# Patient Record
Sex: Female | Born: 1990 | Race: White | Hispanic: No | Marital: Single | Smoking: Former smoker
Health system: Southern US, Community
[De-identification: ages and names within clinical notes are randomized; demographics above are authoritative.]

## PROBLEM LIST (undated history)

## (undated) DIAGNOSIS — F32A Depression, unspecified: Secondary | ICD-10-CM

## (undated) DIAGNOSIS — M76822 Posterior tibial tendinitis, left leg: Secondary | ICD-10-CM

## (undated) DIAGNOSIS — N809 Endometriosis, unspecified: Secondary | ICD-10-CM

## (undated) DIAGNOSIS — F419 Anxiety disorder, unspecified: Secondary | ICD-10-CM

## (undated) DIAGNOSIS — F909 Attention-deficit hyperactivity disorder, unspecified type: Secondary | ICD-10-CM

## (undated) DIAGNOSIS — F329 Major depressive disorder, single episode, unspecified: Secondary | ICD-10-CM

---
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
## 2012-01-13 ENCOUNTER — Encounter (HOSPITAL_COMMUNITY): Payer: Self-pay | Admitting: Emergency Medicine

## 2012-01-13 ENCOUNTER — Emergency Department (HOSPITAL_COMMUNITY)
Admission: EM | Admit: 2012-01-13 | Discharge: 2012-01-13 | Disposition: A | Discharge status: Home / Self Care | Payer: Self-pay | Attending: Emergency Medicine | Admitting: Emergency Medicine

## 2012-01-13 ENCOUNTER — Emergency Department (HOSPITAL_COMMUNITY)
Admission: EM | Admit: 2012-01-13 | Discharge: 2012-01-14 | Disposition: A | Discharge status: Home / Self Care | Payer: Self-pay | Attending: Emergency Medicine | Admitting: Emergency Medicine

## 2012-01-13 ENCOUNTER — Encounter (HOSPITAL_COMMUNITY): Payer: Self-pay | Admitting: *Deleted

## 2012-01-13 DIAGNOSIS — R109 Unspecified abdominal pain: Secondary | ICD-10-CM | POA: Insufficient documentation

## 2012-01-13 DIAGNOSIS — Z0389 Encounter for observation for other suspected diseases and conditions ruled out: Secondary | ICD-10-CM | POA: Insufficient documentation

## 2012-01-13 LAB — CBC WITH DIFFERENTIAL/PLATELET
Lymphocytes Relative: 21 % (ref 12–46)
Lymphs Abs: 2.6 10*3/uL (ref 0.7–4.0)
Neutro Abs: 8.3 10*3/uL — ABNORMAL HIGH (ref 1.7–7.7)
Neutrophils Relative %: 67 % (ref 43–77)
Platelets: 262 10*3/uL (ref 150–400)
RBC: 4.56 MIL/uL (ref 3.87–5.11)
WBC: 12.3 10*3/uL — ABNORMAL HIGH (ref 4.0–10.5)

## 2012-01-13 LAB — BASIC METABOLIC PANEL
CO2: 27 mEq/L (ref 19–32)
Glucose, Bld: 87 mg/dL (ref 70–99)
Potassium: 4 mEq/L (ref 3.5–5.1)
Sodium: 137 mEq/L (ref 135–145)

## 2012-01-13 LAB — URINALYSIS, ROUTINE W REFLEX MICROSCOPIC
Bilirubin Urine: NEGATIVE
Leukocytes, UA: NEGATIVE
Nitrite: NEGATIVE
Specific Gravity, Urine: 1.02 (ref 1.005–1.030)
pH: 8 (ref 5.0–8.0)

## 2012-01-13 LAB — PREGNANCY, URINE: Preg Test, Ur: NEGATIVE

## 2012-01-13 NOTE — ED Notes (Signed)
Pt is here with mid abdominal pain and states that this happens 1-2 times per month.  Pt has history of ovarian cyst

## 2012-01-13 NOTE — ED Notes (Signed)
Pt staes was here and went out to lay down  In her car and could not be found now she is back had labs and ua done

## 2012-01-13 NOTE — ED Notes (Signed)
Pt called to take back to room x1. No answer.

## 2012-01-13 NOTE — ED Notes (Signed)
Pt updated on wait time.  

## 2013-09-23 ENCOUNTER — Emergency Department (HOSPITAL_COMMUNITY)
Admission: EM | Admit: 2013-09-23 | Discharge: 2013-09-23 | Disposition: A | Discharge status: Home / Self Care | Payer: Self-pay | Attending: Emergency Medicine | Admitting: Emergency Medicine

## 2013-09-23 ENCOUNTER — Emergency Department (HOSPITAL_COMMUNITY): Payer: Self-pay

## 2013-09-23 ENCOUNTER — Encounter (HOSPITAL_COMMUNITY): Payer: Self-pay | Admitting: Emergency Medicine

## 2013-09-23 ENCOUNTER — Emergency Department (HOSPITAL_COMMUNITY): Payer: Medicaid Other

## 2013-09-23 DIAGNOSIS — R1031 Right lower quadrant pain: Secondary | ICD-10-CM | POA: Insufficient documentation

## 2013-09-23 DIAGNOSIS — F17200 Nicotine dependence, unspecified, uncomplicated: Secondary | ICD-10-CM | POA: Insufficient documentation

## 2013-09-23 DIAGNOSIS — R3919 Other difficulties with micturition: Secondary | ICD-10-CM | POA: Insufficient documentation

## 2013-09-23 DIAGNOSIS — N83201 Unspecified ovarian cyst, right side: Secondary | ICD-10-CM

## 2013-09-23 DIAGNOSIS — Z3202 Encounter for pregnancy test, result negative: Secondary | ICD-10-CM | POA: Insufficient documentation

## 2013-09-23 DIAGNOSIS — N83209 Unspecified ovarian cyst, unspecified side: Secondary | ICD-10-CM | POA: Insufficient documentation

## 2013-09-23 DIAGNOSIS — Z8659 Personal history of other mental and behavioral disorders: Secondary | ICD-10-CM | POA: Insufficient documentation

## 2013-09-23 DIAGNOSIS — R110 Nausea: Secondary | ICD-10-CM | POA: Insufficient documentation

## 2013-09-23 LAB — CBC WITH DIFFERENTIAL/PLATELET
BASOS PCT: 0 % (ref 0–1)
Basophils Absolute: 0 10*3/uL (ref 0.0–0.1)
EOS ABS: 0.2 10*3/uL (ref 0.0–0.7)
Eosinophils Relative: 2 % (ref 0–5)
HCT: 39.5 % (ref 36.0–46.0)
Hemoglobin: 13.5 g/dL (ref 12.0–15.0)
Lymphocytes Relative: 28 % (ref 12–46)
Lymphs Abs: 2.4 10*3/uL (ref 0.7–4.0)
MCH: 30.5 pg (ref 26.0–34.0)
MCHC: 34.2 g/dL (ref 30.0–36.0)
MCV: 89.4 fL (ref 78.0–100.0)
Monocytes Absolute: 0.8 10*3/uL (ref 0.1–1.0)
Monocytes Relative: 9 % (ref 3–12)
NEUTROS PCT: 61 % (ref 43–77)
Neutro Abs: 5.1 10*3/uL (ref 1.7–7.7)
Platelets: 230 10*3/uL (ref 150–400)
RBC: 4.42 MIL/uL (ref 3.87–5.11)
RDW: 13.6 % (ref 11.5–15.5)
WBC: 8.6 10*3/uL (ref 4.0–10.5)

## 2013-09-23 LAB — URINALYSIS, ROUTINE W REFLEX MICROSCOPIC
Bilirubin Urine: NEGATIVE
GLUCOSE, UA: NEGATIVE mg/dL
Hgb urine dipstick: NEGATIVE
Ketones, ur: NEGATIVE mg/dL
LEUKOCYTES UA: NEGATIVE
NITRITE: NEGATIVE
PH: 6 (ref 5.0–8.0)
Protein, ur: NEGATIVE mg/dL
SPECIFIC GRAVITY, URINE: 1.027 (ref 1.005–1.030)
Urobilinogen, UA: 0.2 mg/dL (ref 0.0–1.0)

## 2013-09-23 LAB — I-STAT CHEM 8, ED
BUN: 20 mg/dL (ref 6–23)
Calcium, Ion: 1.17 mmol/L (ref 1.12–1.23)
Chloride: 103 mEq/L (ref 96–112)
Creatinine, Ser: 0.9 mg/dL (ref 0.50–1.10)
GLUCOSE: 87 mg/dL (ref 70–99)
HCT: 43 % (ref 36.0–46.0)
HEMOGLOBIN: 14.6 g/dL (ref 12.0–15.0)
POTASSIUM: 3.7 meq/L (ref 3.7–5.3)
Sodium: 143 mEq/L (ref 137–147)
TCO2: 24 mmol/L (ref 0–100)

## 2013-09-23 LAB — WET PREP, GENITAL
Trich, Wet Prep: NONE SEEN
Yeast Wet Prep HPF POC: NONE SEEN

## 2013-09-23 LAB — POC URINE PREG, ED: Preg Test, Ur: NEGATIVE

## 2013-09-23 MED ORDER — IOHEXOL 300 MG/ML  SOLN
50.0000 mL | Freq: Once | INTRAMUSCULAR | Status: AC | PRN
  Administered 2013-09-23: 50 mL via ORAL

## 2013-09-23 MED ORDER — ONDANSETRON HCL 4 MG/2ML IJ SOLN
4.0000 mg | Freq: Once | INTRAMUSCULAR | Status: AC
  Administered 2013-09-23: 4 mg via INTRAVENOUS
  Filled 2013-09-23: qty 2

## 2013-09-23 MED ORDER — HYDROCODONE-ACETAMINOPHEN 5-325 MG PO TABS: 2.0000 | ORAL_TABLET | ORAL | Status: DC | PRN

## 2013-09-23 MED ORDER — ONDANSETRON HCL 4 MG PO TABS: 4.0000 mg | ORAL_TABLET | Freq: Four times a day (QID) | ORAL | Status: DC

## 2013-09-23 MED ORDER — MORPHINE SULFATE 4 MG/ML IJ SOLN
4.0000 mg | Freq: Once | INTRAMUSCULAR | Status: AC
  Administered 2013-09-23: 4 mg via INTRAVENOUS
  Filled 2013-09-23: qty 1

## 2013-09-23 MED ORDER — KETOROLAC TROMETHAMINE 30 MG/ML IJ SOLN
30.0000 mg | Freq: Once | INTRAMUSCULAR | Status: AC
  Administered 2013-09-23: 30 mg via INTRAVENOUS
  Filled 2013-09-23: qty 1

## 2013-09-23 MED ORDER — IOHEXOL 300 MG/ML  SOLN
100.0000 mL | Freq: Once | INTRAMUSCULAR | Status: AC | PRN
  Administered 2013-09-23: 100 mL via INTRAVENOUS

## 2013-09-23 NOTE — Progress Notes (Signed)
[facility] provided pt with a list of primary care resources, highlighting [facility] help [facility] establish primary care.

## 2013-09-23 NOTE — ED Notes (Signed)
Pt states she was diagnosed with cyst on right ovary in 8 th grade. Pt present with pain on right lower pelvic area.

## 2013-09-23 NOTE — ED Notes (Signed)
Patient to CT.

## 2013-09-23 NOTE — ED Provider Notes (Signed)
CSN: [id number]     Arrival date & time 09/23/13  4098 History   First MD Initiated Contact with Patient 09/23/13 [phone]     Chief Complaint  Patient presents with  . Cyst     (Consider location/radiation/quality/duration/timing/severity/associated sxs/prior Treatment) HPI Janet Mccoy is a 23 y.o. female who presents to ED with complaint of right lower abdominal pain. Patient states she developed pain when she woke up early this morning. Pain is worsened with movement. Pain does not radiate. He reports difficulty urinating. She reports similar pain in the past, states pain usually comes and goes, usually resolves on its own. She was seen for this multiple years ago was diagnosed with ovarian cysts. States she has not seen an OB/GYN or any other doctors for this since then. She states that when she gets the pain she usually takes Midol or Tylenol but it never helps. She did not take any medications prior to coming in today. She admits to nausea, denies vomiting. She denies any change in bowels. No vaginal bleeding or discharge. Last normal menstrual cycle was 2 weeks ago. Denies being pregnant.   Past Medical History  Diagnosis Date  . Ovarian cyst   . Headache(784.0)   . Depression    Past Surgical History  Procedure Laterality Date  . No past surgeries     No family history on file. History  Substance Use Topics  . Smoking status: Current Every Day Smoker -- 0.50 packs/day    Types: Cigarettes  . Smokeless tobacco: Not on file  . Alcohol Use: No   OB History   Grav Para Term Preterm Abortions TAB SAB Ect Mult Living   0              Review of Systems  Constitutional: Negative for fever and chills.  Respiratory: Negative for cough, chest tightness and shortness of breath.   Cardiovascular: Negative for chest pain, palpitations and leg swelling.  Gastrointestinal: Positive for nausea and abdominal pain. Negative for vomiting, diarrhea, constipation and blood in stool.   Genitourinary: Positive for difficulty urinating. Negative for dysuria, flank pain, vaginal bleeding, vaginal discharge, vaginal pain and pelvic pain.  Musculoskeletal: Negative for arthralgias, myalgias, neck pain and neck stiffness.  Skin: Negative for rash.  Neurological: Negative for dizziness, weakness and headaches.  All other systems reviewed and are negative.     Allergies  Review of patient's allergies indicates no known allergies.  Home Medications   Prior to Admission medications   Not on File   BP 121/72  Pulse 87  Temp(Src) 98.7 F (37.1 C) (Oral)  Resp 20  SpO2 100%  LMP 09/06/2013 Physical Exam  Nursing note and vitals reviewed. Constitutional: She appears well-developed and well-nourished. No distress.  HENT:  Head: Normocephalic.  Eyes: Conjunctivae are normal.  Neck: Neck supple.  Cardiovascular: Normal rate, regular rhythm and normal heart sounds.   Pulmonary/Chest: Effort normal and breath sounds normal. No respiratory distress. She has no wheezes. She has no rales.  Abdominal: Soft. Bowel sounds are normal. She exhibits no distension. There is tenderness. There is no rebound.  Right lower quadrant tenderness  Genitourinary:  Normal external genitalia. No vaginal discharge. No CMT. No left adnexal or uterine tenderness. Right adnexal tenderenss.   Musculoskeletal: She exhibits no edema.  Neurological: She is alert.  Skin: Skin is warm and dry.  Psychiatric: She has a normal mood and affect. Her behavior is normal.    ED Course  Procedures (including critical care time)  Labs Review Labs Reviewed  WET PREP, GENITAL - Abnormal; Notable for the following:    Clue Cells Wet Prep HPF POC FEW (*)    WBC, Wet Prep HPF POC FEW (*)    All other components within normal limits  URINALYSIS, ROUTINE W REFLEX MICROSCOPIC - Abnormal; Notable for the following:    APPearance CLOUDY (*)    All other components within normal limits  GC/CHLAMYDIA PROBE AMP   CBC WITH DIFFERENTIAL  I-STAT CHEM 8, ED  POC URINE PREG, ED    Imaging Review [place] Transvaginal Non-ob  09/23/2013   CLINICAL DATA:  CYST; rt ad pain , torsion; right adnexal pain. eval for torsion  EXAM: TRANSABDOMINAL ULTRASOUND OF PELVIS  DOPPLER ULTRASOUND OF OVARIES  TECHNIQUE: Transabdominal ultrasound examination of the pelvis was performed including evaluation of the uterus, ovaries, adnexal regions, and pelvic cul-de-sac.  Color and duplex Doppler ultrasound was utilized to evaluate blood flow to the ovaries.  COMPARISON:  None.  FINDINGS: Uterus  Measurements: 6.7 x 3 x 4.8 cm No fibroids or other mass visualized.  Endometrium  Thickness: 8 mm  No focal abnormality visualized.  Right ovary  Measurements: 4.3 x 3.8 x 4.7 cm Normal appearance/no adnexal mass.  Left ovary  Measurements: 3.2 x 1.6 x 3.3 cm Normal appearance/no adnexal mass.  Pulsed Doppler evaluation demonstrates normal low-resistance arterial and venous waveforms in both ovaries.  IMPRESSION: Unremarkable pelvic ultrasound. No sonographic evidence of ovarian torsion.   Electronically Signed   By: Salome HolmesHector  Cooper M.D.   On: 09/23/2013 10:05   [place] Pelvis Complete  09/23/2013   CLINICAL DATA:  CYST; rt ad pain , torsion; right adnexal pain. eval for torsion  EXAM: TRANSABDOMINAL ULTRASOUND OF PELVIS  DOPPLER ULTRASOUND OF OVARIES  TECHNIQUE: Transabdominal ultrasound examination of the pelvis was performed including evaluation of the uterus, ovaries, adnexal regions, and pelvic cul-de-sac.  Color and duplex Doppler ultrasound was utilized to evaluate blood flow to the ovaries.  COMPARISON:  None.  FINDINGS: Uterus  Measurements: 6.7 x 3 x 4.8 cm No fibroids or other mass visualized.  Endometrium  Thickness: 8 mm  No focal abnormality visualized.  Right ovary  Measurements: 4.3 x 3.8 x 4.7 cm Normal appearance/no adnexal mass.  Left ovary  Measurements: 3.2 x 1.6 x 3.3 cm Normal appearance/no adnexal mass.  Pulsed Doppler evaluation  demonstrates normal low-resistance arterial and venous waveforms in both ovaries.  IMPRESSION: Unremarkable pelvic ultrasound. No sonographic evidence of ovarian torsion.   Electronically Signed   By: Salome HolmesHector  Cooper M.D.   On: 09/23/2013 10:05   Ct Abdomen Pelvis W Contrast  09/23/2013   CLINICAL DATA:  RIGHT lower pelvic pain, history RIGHT ovarian cyst  EXAM: CT ABDOMEN AND PELVIS WITH CONTRAST  TECHNIQUE: Multidetector CT imaging of the abdomen and pelvis was performed using the standard protocol following bolus administration of intravenous contrast. Sagittal and coronal MPR images reconstructed from axial data set.  CONTRAST:  100mL OMNIPAQUE IOHEXOL 300 MG/ML SOLN IV. Dilute oral contrast.  COMPARISON:  05/26/2007 CT abdomen and pelvis ; correlation ultrasound pelvis 09/23/2013  FINDINGS: Lung bases clear.  Liver, spleen, pancreas, kidneys, and adrenal glands normal appearance.  Prominent central low attenuation of uterus may be related to phase of menses.  Question small complicated or hemorrhagic RIGHT ovarian cyst 2.5 x 1.9 cm, not definitely seen on preceding ultrasound.  Adnexae otherwise unremarkable.  Appendix not visualized.  Stomach and bowel loops grossly normal appearance.  No mass, adenopathy, free fluid,  or inflammatory process otherwise seen.  Small amount of air within vagina.  No acute osseous findings.  IMPRESSION: Questionable small hemorrhagic or complicated cyst in the RIGHT adnexa, not definitely visualized on preceding ultrasound, of doubtful significance based on small size.  Otherwise negative exam.   Electronically Signed   By: Ulyses SouthwardMark  Boles M.D.   On: 09/23/2013 11:49   [place] Art/ven Flow Abd Pelv Doppler  09/23/2013   CLINICAL DATA:  CYST; rt ad pain , torsion; right adnexal pain. eval for torsion  EXAM: TRANSABDOMINAL ULTRASOUND OF PELVIS  DOPPLER ULTRASOUND OF OVARIES  TECHNIQUE: Transabdominal ultrasound examination of the pelvis was performed including evaluation of the uterus,  ovaries, adnexal regions, and pelvic cul-de-sac.  Color and duplex Doppler ultrasound was utilized to evaluate blood flow to the ovaries.  COMPARISON:  None.  FINDINGS: Uterus  Measurements: 6.7 x 3 x 4.8 cm No fibroids or other mass visualized.  Endometrium  Thickness: 8 mm  No focal abnormality visualized.  Right ovary  Measurements: 4.3 x 3.8 x 4.7 cm Normal appearance/no adnexal mass.  Left ovary  Measurements: 3.2 x 1.6 x 3.3 cm Normal appearance/no adnexal mass.  Pulsed Doppler evaluation demonstrates normal low-resistance arterial and venous waveforms in both ovaries.  IMPRESSION: Unremarkable pelvic ultrasound. No sonographic evidence of ovarian torsion.   Electronically Signed   By: Salome HolmesHector  Cooper M.D.   On: 09/23/2013 10:05     EKG Interpretation None      MDM   Final diagnoses:  Right ovarian cyst   Pt with severe right sided adnexal pain. Will get labs, pelvic exam, US to rule out torsion.   10:20 AM US negative. Will get CT abd./pelvs. Pt continues to have pain in right lower abdomen, will evaluate appendix   12:29 PM CT negative other than possible right adnexal hemorrhagic cyst. Pt feeling much better. Home with pain medications, anti emetics, follow up with GYN   Filed Vitals:   09/23/13 0734 09/23/13 1129  BP: 121/72 119/78  Pulse: 87 60  Temp: 98.7 F (37.1 C)   TempSrc: Oral   Resp: 20 18  SpO2: 100% 100%     Tatyana A Kirichenko, PA-C 09/23/13 1525

## 2013-09-23 NOTE — Discharge Instructions (Signed)
Take ibuprofen or aleve for pain. norco for severe pain only. zofran for nausea. Follow up with your doctor or OB/GYN specialist.    Ovarian Cyst An ovarian cyst is a fluid-filled sac that forms on an ovary. The ovaries are small organs that produce eggs in women. Various types of cysts can form on the ovaries. Most are not cancerous. Many do not cause problems, and they often go away on their own. Some may cause symptoms and require treatment. Common types of ovarian cysts include:  Functional cysts--These cysts may occur every month during the menstrual cycle. This is normal. The cysts usually go away with the next menstrual cycle if the woman does not get pregnant. Usually, there are no symptoms with a functional cyst.  Endometrioma cysts--These cysts form from the tissue that lines the uterus. They are also called "chocolate cysts" because they become filled with blood that turns brown. This type of cyst can cause pain in the lower abdomen during intercourse and with your menstrual period.  Cystadenoma cysts--This type develops from the cells on the outside of the ovary. These cysts can get very big and cause lower abdomen pain and pain with intercourse. This type of cyst can twist on itself, cut off its blood supply, and cause severe pain. It can also easily rupture and cause a lot of pain.  Dermoid cysts--This type of cyst is sometimes found in both ovaries. These cysts may contain different kinds of body tissue, such as skin, teeth, hair, or cartilage. They usually do not cause symptoms unless they get very big.  Theca lutein cysts--These cysts occur when too much of a certain hormone (human chorionic gonadotropin) is produced and overstimulates the ovaries to produce an egg. This is most common after procedures used to assist with the conception of a baby (in vitro fertilization). CAUSES   Fertility drugs can cause a condition in which multiple large cysts are formed on the ovaries. This is  called ovarian hyperstimulation syndrome.  A condition called polycystic ovary syndrome can cause hormonal imbalances that can lead to nonfunctional ovarian cysts. SIGNS AND SYMPTOMS  Many ovarian cysts do not cause symptoms. If symptoms are present, they may include:  Pelvic pain or pressure.  Pain in the lower abdomen.  Pain during sexual intercourse.  Increasing girth (swelling) of the abdomen.  Abnormal menstrual periods.  Increasing pain with menstrual periods.  Stopping having menstrual periods without being pregnant. DIAGNOSIS  These cysts are commonly found during a routine or annual pelvic exam. Tests may be ordered to find out more about the cyst. These tests may include:  Ultrasound.  X-ray of the pelvis.  CT scan.  MRI.  Blood tests. TREATMENT  Many ovarian cysts go away on their own without treatment. Your health care provider may want to check your cyst regularly for 2-3 months to see if it changes. For women in menopause, it is particularly important to monitor a cyst closely because of the higher rate of ovarian cancer in menopausal women. When treatment is needed, it may include any of the following:  A procedure to drain the cyst (aspiration). This may be done using a long needle and ultrasound. It can also be done through a laparoscopic procedure. This involves using a thin, lighted tube with a tiny camera on the end (laparoscope) inserted through a small incision.  Surgery to remove the whole cyst. This may be done using laparoscopic surgery or an open surgery involving a larger incision in the lower abdomen.  Hormone treatment or birth control pills. These methods are sometimes used to help dissolve a cyst. HOME CARE INSTRUCTIONS   Only take over-the-counter or prescription medicines as directed by your health care provider.  Follow up with your health care provider as directed.  Get regular pelvic exams and Pap tests. SEEK MEDICAL CARE IF:   Your  periods are late, irregular, or painful, or they stop.  Your pelvic pain or abdominal pain does not go away.  Your abdomen becomes larger or swollen.  You have pressure on your bladder or trouble emptying your bladder completely.  You have pain during sexual intercourse.  You have feelings of fullness, pressure, or discomfort in your stomach.  You lose weight for no apparent reason.  You feel generally ill.  You become constipated.  You lose your appetite.  You develop acne.  You have an increase in body and facial hair.  You are gaining weight, without changing your exercise and eating habits.  You think you are pregnant. SEEK IMMEDIATE MEDICAL CARE IF:   You have increasing abdominal pain.  You feel sick to your stomach (nauseous), and you throw up (vomit).  You develop a fever that comes on suddenly.  You have abdominal pain during a bowel movement.  Your menstrual periods become heavier than usual. MAKE SURE YOU:  Understand these instructions.  Will watch your condition.  Will get help right away if you are not doing well or get worse. Document Released: 03/25/2005 Document Revised: 03/30/2013 Document Reviewed: 11/30/2012 [facility] Patient Information 2015 [employer] [place]. This information is not intended to replace advice given to you by your health care provider. Make sure you discuss any questions you have with your health care provider.

## 2013-09-23 NOTE — ED Notes (Signed)
Patient transported to Ultrasound 

## 2013-09-25 LAB — GC/CHLAMYDIA PROBE AMP
CT Probe RNA: NEGATIVE
GC PROBE AMP APTIMA: NEGATIVE

## 2013-09-27 NOTE — ED Provider Notes (Signed)
Medical screening examination/treatment/procedure(s) were performed by non-physician practitioner and as supervising physician I was immediately available for consultation/collaboration.   Toy BakerAnthony T Allen, MD 09/27/13 [phone]

## 2013-11-01 NOTE — H&P (Signed)
Lenise ArenaWhittney Keys  DICTATION # [id number] CSN# [id number]   Meriel PicaHOLLAND,Sarita Hakanson M, MD 11/01/2013 9:22 AM

## 2013-11-03 NOTE — H&P (Signed)
NAMLenise Arena:  Deriso, Deniqua             ACCOUNT NO.:  [id number]  MEDICAL RECORD NO.:  [id number]  LOCATION:                                 FACILITY:  PHYSICIAN:  Duke Salviaichard M. Marcelle OverlieHolland, M.D.DATE OF BIRTH:  January 26, 1991  DATE OF ADMISSION: DATE OF DISCHARGE:                             HISTORY & PHYSICAL   CHIEF COMPLAINT:  Acute and chronic pelvic pain.  HISTORY OF PRESENT ILLNESS:  A 23 year old, G0, P0, now currently sexually active presents for diagnostic laparoscopy to evaluate her acute and chronic pelvic pain.  She states she has had mid and right lower quadrant pain off and on since age 23 worse during her period.  It has not improved over time.  Recently was evaluated in the ED with pelvic ultrasound and showed no evidence of torsion.  Wet prep negative, GC and Chlamydia negative.  This pain has continued to worsen to the point that she prefers to have definitive evaluation.  We did discuss a trial of Lupron versus diagnostic laparoscopy.  She would prefer the latter.  This procedure including specific risks of bleeding, infection, other complications may require additional or open surgery reviewed with her which she understands and accepts.  PAST MEDICAL HISTORY:  Allergies none.  CURRENT MEDICATIONS:  Vicodin p.r.n.  PRIOR SURGERY:  None.  REVIEW OF SYSTEMS:  Otherwise unremarkable except for abdominal pain and dyspareunia.  PHYSICAL EXAMINATION:  VITAL SIGNS:  Temp 98.2, blood pressure 118/80. HEENT:  Unremarkable. NECK:  Supple without masses. LUNGS:  Clear. CARDIOVASCULAR:  Regular rate and rhythm without murmurs, rubs, or gallops noted. BREASTS:  Without masses. ABDOMEN:  Soft, flat, and nontender. GU:  Vulva, vagina and cervix normal.  Uterus mid position, normal size. No unusual nodularity or tenderness except on the right side, with some mild tenderness noted.  IMPRESSION:  Acute and chronic pelvic pain, rule out endometriosis or other causes for  pain.  PLAN:  Diagnostic laparoscopy.  Procedure and risks reviewed as above.     Hailie Searight M. Marcelle OverlieHolland, M.D.     [facility]/MEDQ  D:  11/01/2013  T:  11/01/2013  Job:  [id number]

## 2013-11-04 ENCOUNTER — Encounter (HOSPITAL_BASED_OUTPATIENT_CLINIC_OR_DEPARTMENT_OTHER): Payer: Self-pay | Admitting: *Deleted

## 2013-11-04 NOTE — Progress Notes (Signed)
NPO AFTER MN. ARRIVE AT 0600. NEEDS URINE PREG. AND HG. MAY TAKE ONE TYPE PAIN RX IF NEEDED AM DOS W/ SIPS OF WATER. PT ADVISED TO CUT BACK ON MARIJUANA USE.

## 2013-11-10 ENCOUNTER — Encounter (HOSPITAL_BASED_OUTPATIENT_CLINIC_OR_DEPARTMENT_OTHER): Payer: Self-pay | Admitting: Anesthesiology

## 2013-11-10 NOTE — Anesthesia Preprocedure Evaluation (Addendum)
Anesthesia Evaluation  Patient identified by MRN, date of birth, ID band Patient awake    Reviewed: Allergy & Precautions, H&P , NPO status , Patient's Chart, lab work & pertinent test results  Airway Mallampati: II TM Distance: >3 FB Neck ROM: Full    Dental no notable dental hx. (+) Teeth Intact, Dental Advisory Given   Pulmonary Current Smoker,  breath sounds clear to auscultation  Pulmonary exam normal       Cardiovascular Exercise Tolerance: Good negative cardio ROS  Rhythm:Regular Rate:Normal     Neuro/Psych  Headaches, negative psych ROS   GI/Hepatic negative GI ROS, Neg liver ROS,   Endo/Other  negative endocrine ROS  Renal/GU negative Renal ROS  negative genitourinary   Musculoskeletal negative musculoskeletal ROS (+)   Abdominal   Peds negative pediatric ROS (+)  Hematology negative hematology ROS (+)   Anesthesia Other Findings   Reproductive/Obstetrics negative OB ROS                        Anesthesia Physical Anesthesia Plan  ASA: II  Anesthesia Plan: General   Post-op Pain Management:    Induction: Intravenous  Airway Management Planned: Oral ETT  Additional Equipment:   Intra-op Plan:   Post-operative Plan: Extubation in OR  Informed Consent: I have reviewed the patients History and Physical, chart, labs and discussed the procedure including the risks, benefits and alternatives for the proposed anesthesia with the patient or authorized representative who has indicated his/her understanding and acceptance.   Dental advisory given  Plan Discussed with: CRNA and Anesthesiologist  Anesthesia Plan Comments:        Anesthesia Quick Evaluation

## 2013-11-11 ENCOUNTER — Ambulatory Visit (HOSPITAL_BASED_OUTPATIENT_CLINIC_OR_DEPARTMENT_OTHER): Payer: Medicaid Other | Admitting: Anesthesiology

## 2013-11-11 ENCOUNTER — Encounter (HOSPITAL_BASED_OUTPATIENT_CLINIC_OR_DEPARTMENT_OTHER)
Admission: RE | Disposition: A | Discharge status: Home / Self Care | Payer: Self-pay | Source: Ambulatory Visit | Attending: Obstetrics and Gynecology

## 2013-11-11 ENCOUNTER — Encounter (HOSPITAL_BASED_OUTPATIENT_CLINIC_OR_DEPARTMENT_OTHER): Payer: Self-pay | Admitting: *Deleted

## 2013-11-11 ENCOUNTER — Ambulatory Visit (HOSPITAL_BASED_OUTPATIENT_CLINIC_OR_DEPARTMENT_OTHER)
Admission: RE | Admit: 2013-11-11 | Discharge: 2013-11-11 | Disposition: A | Discharge status: Home / Self Care | Payer: Self-pay | Source: Ambulatory Visit | Attending: Obstetrics and Gynecology | Admitting: Obstetrics and Gynecology

## 2013-11-11 ENCOUNTER — Encounter (HOSPITAL_BASED_OUTPATIENT_CLINIC_OR_DEPARTMENT_OTHER): Payer: Self-pay | Admitting: Anesthesiology

## 2013-11-11 DIAGNOSIS — N803 Endometriosis of pelvic peritoneum, unspecified: Secondary | ICD-10-CM | POA: Insufficient documentation

## 2013-11-11 DIAGNOSIS — R102 Pelvic and perineal pain: Secondary | ICD-10-CM | POA: Diagnosis present

## 2013-11-11 DIAGNOSIS — G8929 Other chronic pain: Secondary | ICD-10-CM | POA: Insufficient documentation

## 2013-11-11 DIAGNOSIS — N809 Endometriosis, unspecified: Secondary | ICD-10-CM

## 2013-11-11 DIAGNOSIS — N949 Unspecified condition associated with female genital organs and menstrual cycle: Secondary | ICD-10-CM | POA: Insufficient documentation

## 2013-11-11 DIAGNOSIS — K660 Peritoneal adhesions (postprocedural) (postinfection): Secondary | ICD-10-CM | POA: Insufficient documentation

## 2013-11-11 HISTORY — PX: LAPAROSCOPY: SHX197

## 2013-11-11 LAB — POCT HEMOGLOBIN-HEMACUE: HEMOGLOBIN: 13 g/dL (ref 12.0–15.0)

## 2013-11-11 LAB — POCT PREGNANCY, URINE: Preg Test, Ur: NEGATIVE

## 2013-11-11 SURGERY — LAPAROSCOPY, DIAGNOSTIC
Anesthesia: General | Site: Abdomen

## 2013-11-11 MED ORDER — HYDROCODONE-ACETAMINOPHEN 5-325 MG PO TABS: 1.0000 | ORAL_TABLET | Freq: Four times a day (QID) | ORAL | Status: DC | PRN

## 2013-11-11 MED ORDER — ACETAMINOPHEN 10 MG/ML IV SOLN
INTRAVENOUS | Status: DC | PRN
  Administered 2013-11-11: 1000 mg via INTRAVENOUS

## 2013-11-11 MED ORDER — DEXAMETHASONE SODIUM PHOSPHATE 4 MG/ML IJ SOLN
INTRAMUSCULAR | Status: DC | PRN
  Administered 2013-11-11: 10 mg via INTRAVENOUS

## 2013-11-11 MED ORDER — HYDROCODONE-ACETAMINOPHEN 5-325 MG PO TABS
ORAL_TABLET | ORAL | Status: AC
  Filled 2013-11-11: qty 1

## 2013-11-11 MED ORDER — PROPOFOL 10 MG/ML IV BOLUS
INTRAVENOUS | Status: DC | PRN
  Administered 2013-11-11: 50 mg via INTRAVENOUS
  Administered 2013-11-11: 150 mg via INTRAVENOUS

## 2013-11-11 MED ORDER — LIDOCAINE HCL 4 % MT SOLN
OROMUCOSAL | Status: DC | PRN
  Administered 2013-11-11: 2 mL via TOPICAL

## 2013-11-11 MED ORDER — FENTANYL CITRATE 0.05 MG/ML IJ SOLN
25.0000 ug | INTRAMUSCULAR | Status: DC | PRN
  Administered 2013-11-11: 50 ug via INTRAVENOUS
  Filled 2013-11-11: qty 1

## 2013-11-11 MED ORDER — MIDAZOLAM HCL 2 MG/2ML IJ SOLN
INTRAMUSCULAR | Status: AC
  Filled 2013-11-11: qty 2

## 2013-11-11 MED ORDER — FENTANYL CITRATE 0.05 MG/ML IJ SOLN
INTRAMUSCULAR | Status: AC
  Filled 2013-11-11: qty 2

## 2013-11-11 MED ORDER — IBUPROFEN 800 MG PO TABS
800.0000 mg | ORAL_TABLET | Freq: Four times a day (QID) | ORAL | Status: DC | PRN
  Filled 2013-11-11: qty 1

## 2013-11-11 MED ORDER — FENTANYL CITRATE 0.05 MG/ML IJ SOLN
INTRAMUSCULAR | Status: DC | PRN
  Administered 2013-11-11 (×2): 50 ug via INTRAVENOUS

## 2013-11-11 MED ORDER — LACTATED RINGERS IR SOLN
Status: DC | PRN
  Administered 2013-11-11: 3000 mL

## 2013-11-11 MED ORDER — SUCCINYLCHOLINE CHLORIDE 20 MG/ML IJ SOLN
INTRAMUSCULAR | Status: DC | PRN
  Administered 2013-11-11: 100 mg via INTRAVENOUS

## 2013-11-11 MED ORDER — ONDANSETRON HCL 4 MG/2ML IJ SOLN
INTRAMUSCULAR | Status: DC | PRN
  Administered 2013-11-11: 4 mg via INTRAVENOUS

## 2013-11-11 MED ORDER — LACTATED RINGERS IV SOLN
INTRAVENOUS | Status: DC
  Administered 2013-11-11 (×2): via INTRAVENOUS
  Filled 2013-11-11: qty 1000

## 2013-11-11 MED ORDER — MIDAZOLAM HCL 5 MG/5ML IJ SOLN
INTRAMUSCULAR | Status: DC | PRN
  Administered 2013-11-11: 2 mg via INTRAVENOUS

## 2013-11-11 MED ORDER — HYDROCODONE-ACETAMINOPHEN 5-325 MG PO TABS
1.0000 | ORAL_TABLET | Freq: Four times a day (QID) | ORAL | Status: DC | PRN
  Administered 2013-11-11: 2 via ORAL
  Filled 2013-11-11: qty 2

## 2013-11-11 MED ORDER — BUPIVACAINE HCL (PF) 0.25 % IJ SOLN
INTRAMUSCULAR | Status: DC | PRN
  Administered 2013-11-11: 6 mL

## 2013-11-11 MED ORDER — LIDOCAINE HCL (CARDIAC) 20 MG/ML IV SOLN
INTRAVENOUS | Status: DC | PRN
  Administered 2013-11-11: 80 mg via INTRAVENOUS

## 2013-11-11 MED ORDER — KETOROLAC TROMETHAMINE 30 MG/ML IJ SOLN
INTRAMUSCULAR | Status: DC | PRN
  Administered 2013-11-11: 30 mg via INTRAVENOUS

## 2013-11-11 MED ORDER — FENTANYL CITRATE 0.05 MG/ML IJ SOLN
INTRAMUSCULAR | Status: AC
  Filled 2013-11-11: qty 4

## 2013-11-11 MED ORDER — STERILE WATER FOR IRRIGATION IR SOLN
Status: DC | PRN
  Administered 2013-11-11: 500 mL

## 2013-11-11 MED ORDER — PROMETHAZINE HCL 25 MG/ML IJ SOLN
6.2500 mg | INTRAMUSCULAR | Status: DC | PRN
  Filled 2013-11-11: qty 1

## 2013-11-11 SURGICAL SUPPLY — 40 items
ADH SKN CLS APL DERMABOND .7 (GAUZE/BANDAGES/DRESSINGS) ×1
BLADE SURG 11 STRL SS (BLADE) ×3 IMPLANT
CATH ROBINSON RED A/P 16FR (CATHETERS) ×3 IMPLANT
CLOSURE WOUND 1/4X4 (GAUZE/BANDAGES/DRESSINGS) ×1
COVER MAYO STAND STRL (DRAPES) ×3 IMPLANT
DERMABOND ADVANCED (GAUZE/BANDAGES/DRESSINGS) ×2
DERMABOND ADVANCED .7 DNX12 (GAUZE/BANDAGES/DRESSINGS) ×1 IMPLANT
DRAPE CAMERA CLOSED 9X96 (DRAPES) ×3 IMPLANT
DRAPE UNDERBUTTOCKS STRL (DRAPE) ×3 IMPLANT
ELECT REM PT RETURN 9FT ADLT (ELECTROSURGICAL) ×3
ELECTRODE REM PT RTRN 9FT ADLT (ELECTROSURGICAL) ×1 IMPLANT
GLOVE BIO SURGEON STRL SZ 6.5 (GLOVE) ×2 IMPLANT
GLOVE BIO SURGEON STRL SZ7 (GLOVE) ×6 IMPLANT
GLOVE BIO SURGEONS STRL SZ 6.5 (GLOVE) ×1
GLOVE INDICATOR 6.5 STRL GRN (GLOVE) ×3 IMPLANT
GOWN STRL REUS W/ TWL LRG LVL3 (GOWN DISPOSABLE) ×1 IMPLANT
GOWN STRL REUS W/TWL LRG LVL3 (GOWN DISPOSABLE) ×3
GOWN STRL REUS W/TWL XL LVL3 (GOWN DISPOSABLE) ×3 IMPLANT
LEGGING LITHOTOMY PAIR STRL (DRAPES) ×3 IMPLANT
NEEDLE HYPO 25X1 1.5 SAFETY (NEEDLE) ×3 IMPLANT
NEEDLE INSUFFLATION 14GA 120MM (NEEDLE) ×3 IMPLANT
PACK BASIN DAY SURGERY FS (CUSTOM PROCEDURE TRAY) ×3 IMPLANT
PACK LAPAROSCOPY II (CUSTOM PROCEDURE TRAY) ×3 IMPLANT
PAD OB MATERNITY 4.3X12.25 (PERSONAL CARE ITEMS) ×3 IMPLANT
PAD PREP 24X48 CUFFED NSTRL (MISCELLANEOUS) ×3 IMPLANT
SCISSORS LAP 5X35 DISP (ENDOMECHANICALS) ×3 IMPLANT
SET IRRIG TUBING LAPAROSCOPIC (IRRIGATION / IRRIGATOR) ×3 IMPLANT
SOLUTION ANTI FOG 6CC (MISCELLANEOUS) ×3 IMPLANT
STRIP CLOSURE SKIN 1/4X4 (GAUZE/BANDAGES/DRESSINGS) ×2 IMPLANT
SUT MNCRL AB 4-0 PS2 18 (SUTURE) ×3 IMPLANT
SUT VIC AB 2-0 UR6 27 (SUTURE) ×3 IMPLANT
SUT VICRYL 0 UR6 27IN ABS (SUTURE) ×3 IMPLANT
SYR CONTROL 10ML LL (SYRINGE) ×3 IMPLANT
SYRINGE 10CC LL (SYRINGE) ×3 IMPLANT
TOWEL OR 17X24 6PK STRL BLUE (TOWEL DISPOSABLE) ×6 IMPLANT
TRAY DSU PREP LF (CUSTOM PROCEDURE TRAY) ×3 IMPLANT
TROCAR OPTI TIP 5M 100M (ENDOMECHANICALS) ×3 IMPLANT
TROCAR XCEL DIL TIP R 11M (ENDOMECHANICALS) ×3 IMPLANT
TUBING INSUFFLATION 10FT LAP (TUBING) ×3 IMPLANT
WATER STERILE IRR 500ML POUR (IV SOLUTION) ×3 IMPLANT

## 2013-11-11 NOTE — Anesthesia Postprocedure Evaluation (Signed)
  Anesthesia Post-op Note  Patient: Janet ArenaWhittney Mccoy  Procedure(s) Performed: Procedure(s) (LRB): LAPAROSCOPY DIAGNOSTIC, lysis of adhesions right adenexa (N/A)  Patient Location: PACU  Anesthesia Type: General  Level of Consciousness: awake and alert   Airway and Oxygen Therapy: Patient Spontanous Breathing  Post-op Pain: mild  Post-op Assessment: Post-op Vital signs reviewed, Patient's Cardiovascular Status Stable, Respiratory Function Stable, Patent Airway and No signs of Nausea or vomiting  Last Vitals:  Filed Vitals:   11/11/13 0915  BP:   Pulse: 53  Temp:   Resp: 14    Post-op Vital Signs: stable   Complications: No apparent anesthesia complications

## 2013-11-11 NOTE — Progress Notes (Signed)
The patient was re-examined with no change in status 

## 2013-11-11 NOTE — Transfer of Care (Signed)
Immediate Anesthesia Transfer of Care Note  Patient: Janet ArenaWhittney Mccoy  Procedure(s) Performed: Procedure(s): LAPAROSCOPY DIAGNOSTIC, lysis of adhesions right adenexa (N/A)  Patient Location: PACU  Anesthesia Type:General  Level of Consciousness: awake, alert , oriented and patient cooperative  Airway & Oxygen Therapy: Patient Spontanous Breathing and Patient connected to nasal cannula oxygen  Post-op Assessment: Report given to PACU RN and Post -op Vital signs reviewed and stable  Post vital signs: Reviewed and stable  Complications: No apparent anesthesia complications

## 2013-11-11 NOTE — Discharge Instructions (Signed)
°  Post Anesthesia Home Care Instructions ° °Activity: °Get plenty of rest for the remainder of the day. A responsible adult should stay with you for 24 hours following the procedure.  °For the next 24 hours, DO NOT: °-Drive a car °-Operate machinery °-Drink alcoholic beverages °-Take any medication unless instructed by your physician °-Make any legal decisions or sign important papers. ° °Meals: °Start with liquid foods such as gelatin or soup. Progress to regular foods as tolerated. Avoid greasy, spicy, heavy foods. If nausea and/or vomiting occur, drink only clear liquids until the nausea and/or vomiting subsides. Call your physician if vomiting continues. ° °Special Instructions/Symptoms: °Your throat may feel dry or sore from the anesthesia or the breathing tube placed in your throat during surgery. If this causes discomfort, gargle with warm salt water. The discomfort should disappear within 24 hours. ° °HOME CARE INSTRUCTIONS - LAPAROSCOPY ° °Wound Care: The bandaids or dressing which are placed over the skin openings may be removed the day after surgery. The incision should be kept clean and dry. The stitches do not need to be removed. Should the incision become sore, red, and swollen after the first week, check with your doctor. ° °Personal Hygiene: Shower the day after your procedure. Always wipe from front to back after elimination.  ° °Activity: Do not drive or operate any equipment today. The effects of the anesthesia are still present and drowsiness may result. Rest today, not necessarily flat bed rest, just take it easy. You may resume your normal activity in one to three days or as instructed by your physician. ° °Sexual Activity: You resume sexual activity as indicated by your physician_________. °If your laparoscopy was for a sterilization ( tubes tied ), continue current method of birth control until after your next period or ask for specific instructions from your doctor. ° °Diet: Eat a light diet  as desired this evening. You may resume a regular diet tomorrow. ° °Return to Work: Two to three days or as indicated by your doctor. ° °Expectations °After Surgery: Your surgery will cause vaginal drainage or spotting which may continue for 2-3 days. Mild abdominal discomfort or tenderness is not unusual and some shoulder pain may also be noted which can be relieved by lying flat in pain. ° °Call Your Doctor °If these Occur:  Persistent or heavy bleeding at incision site °      Redness or swelling around incision °      Elevation of temperature greater than 100 degrees F ° °Call for follow-up appointment _____________. °

## 2013-11-11 NOTE — Op Note (Signed)
Preoperative diagnosis: Acute and chronic pelvic pain  Postoperative diagnosis: Same plus pelvic endometriosis, right adnexal adhesions  Procedure: Diagnostic laparoscopy, lysis of adhesions  Surgeon: Marcelle OverlieHolland  Anesthesia: Gen.  EBL: Less than 10 cc  Drains: In and out Foley catheter  Procedure and findings:  Patient taken the operating room after an adequate level of general anesthesia was obtained with the patient's legs in stirrups the abdomen perineum and vagina were prepped and draped in usual fashion for laparoscopy. The bladder was drained, EUA was carried out uterus was midposition, normal size, adnexa negative. Hulka tenaculum was positioned. This was done after appropriate timeout for taken.  Attention directed to the abdomen the subumbilical area was infiltrated with quarter percent Marcaine plain small incision was made in the varies needle was introduced without difficulty. Its intra-abdominal position was verified by pressure and water testing. After 2 L pneumoperitoneum syncopated, lap scopic trocar and sleeve were then introduced that difficulty. There was no evidence of any bleeding or trauma. 3 finger breaths above the symphysis in the midline a 5 mm trocar was inserted under direct visualization. The patient placed in Trendelenburg and the pelvic findings as follows  The upper abdomen was unremarkable as was the ileum ascending colon area and appendix.  The uterus itself was normal size the serosa appeared be normal the anterior peritoneum was unremarkable. The left adnexa tube and ovary appeared to be normal I have run the left pelvic sidewall there were a few peritoneal defects were some early stage endometriosis noted. I right side there were some filmy periadnexal adhesions around the right ovary which were lysed upon Nob elevation a blunt dissection. There were some early stage endometriosis noted around the peritoneum on the lateral pelvic wall where the head of the  adhesions had been stuck. Once these areas for further documented, the uterosacral ligaments were inspected and noted be unremarkable in the deep cul-de-sac. Nezhat was then used to irrigate and aspirate, operative sites were hemostatic. Its was removed, gout gas allowed to escape because closed with 4-0 Monocryl subcuticular at the umbilicus and Dermabond at the suprapubic. She tolerated this well went to recovery room in good condition.  She received Toradol and Tylenol per anesthesia  Duke Salviaichard M. Marcelle OverlieHolland M.D. and

## 2013-11-11 NOTE — Anesthesia Procedure Notes (Signed)
Procedure Name: Intubation Date/Time: 11/11/2013 7:37 AM Performed by: Wanita Chamberlain Pre-anesthesia Checklist: Patient identified, Timeout performed, Emergency Drugs available, Suction available and Patient being monitored Patient Re-evaluated:Patient Re-evaluated prior to inductionOxygen Delivery Method: Circle system utilized Preoxygenation: Pre-oxygenation with 100% oxygen Intubation Type: IV induction Ventilation: Mask ventilation without difficulty Laryngoscope Size: Mac and 3 Grade View: Grade I Tube type: Oral Tube size: 7.0 mm Number of attempts: 1 Airway Equipment and Method: Stylet,  Bite block and LTA kit utilized Placement Confirmation: ETT inserted through vocal cords under direct vision,  positive ETCO2 and breath sounds checked- equal and bilateral Secured at: 21 cm Tube secured with: Tape Dental Injury: Teeth and Oropharynx as per pre-operative assessment

## 2013-11-12 ENCOUNTER — Encounter (HOSPITAL_BASED_OUTPATIENT_CLINIC_OR_DEPARTMENT_OTHER): Payer: Self-pay | Admitting: Obstetrics and Gynecology

## 2014-03-29 ENCOUNTER — Other Ambulatory Visit (HOSPITAL_COMMUNITY): Payer: Self-pay | Admitting: Obstetrics and Gynecology

## 2014-03-29 DIAGNOSIS — N809 Endometriosis, unspecified: Secondary | ICD-10-CM

## 2014-03-29 DIAGNOSIS — R1031 Right lower quadrant pain: Secondary | ICD-10-CM

## 2014-03-29 DIAGNOSIS — R102 Pelvic and perineal pain: Secondary | ICD-10-CM

## 2014-03-30 ENCOUNTER — Encounter (INDEPENDENT_AMBULATORY_CARE_PROVIDER_SITE_OTHER): Payer: Self-pay

## 2014-03-30 ENCOUNTER — Ambulatory Visit (HOSPITAL_COMMUNITY)
Admission: RE | Admit: 2014-03-30 | Discharge: 2014-03-30 | Disposition: A | Discharge status: Home / Self Care | Payer: BC Managed Care – PPO | Source: Ambulatory Visit | Attending: Obstetrics and Gynecology | Admitting: Obstetrics and Gynecology

## 2014-03-30 ENCOUNTER — Encounter (HOSPITAL_COMMUNITY): Payer: Self-pay

## 2014-03-30 DIAGNOSIS — R102 Pelvic and perineal pain: Secondary | ICD-10-CM | POA: Diagnosis not present

## 2014-03-30 DIAGNOSIS — R1031 Right lower quadrant pain: Secondary | ICD-10-CM | POA: Diagnosis not present

## 2014-03-30 DIAGNOSIS — N809 Endometriosis, unspecified: Secondary | ICD-10-CM | POA: Diagnosis present

## 2014-03-30 MED ORDER — IOHEXOL 300 MG/ML  SOLN
100.0000 mL | Freq: Once | INTRAMUSCULAR | Status: AC | PRN
  Administered 2014-03-30: 100 mL via INTRAVENOUS

## 2015-11-02 ENCOUNTER — Emergency Department (HOSPITAL_COMMUNITY): Payer: Worker's Compensation

## 2015-11-02 ENCOUNTER — Emergency Department (HOSPITAL_COMMUNITY)
Admission: EM | Admit: 2015-11-02 | Discharge: 2015-11-02 | Disposition: A | Discharge status: Home / Self Care | Payer: Worker's Compensation | Attending: Emergency Medicine | Admitting: Emergency Medicine

## 2015-11-02 ENCOUNTER — Encounter (HOSPITAL_COMMUNITY): Payer: Self-pay | Admitting: *Deleted

## 2015-11-02 DIAGNOSIS — F17210 Nicotine dependence, cigarettes, uncomplicated: Secondary | ICD-10-CM | POA: Diagnosis not present

## 2015-11-02 DIAGNOSIS — S92422A Displaced fracture of distal phalanx of left great toe, initial encounter for closed fracture: Secondary | ICD-10-CM | POA: Diagnosis not present

## 2015-11-02 DIAGNOSIS — Y999 Unspecified external cause status: Secondary | ICD-10-CM | POA: Insufficient documentation

## 2015-11-02 DIAGNOSIS — Y929 Unspecified place or not applicable: Secondary | ICD-10-CM | POA: Diagnosis not present

## 2015-11-02 DIAGNOSIS — Y9389 Activity, other specified: Secondary | ICD-10-CM | POA: Diagnosis not present

## 2015-11-02 DIAGNOSIS — Z79899 Other long term (current) drug therapy: Secondary | ICD-10-CM | POA: Diagnosis not present

## 2015-11-02 DIAGNOSIS — W208XXA Other cause of strike by thrown, projected or falling object, initial encounter: Secondary | ICD-10-CM | POA: Insufficient documentation

## 2015-11-02 DIAGNOSIS — S92499A Other fracture of unspecified great toe, initial encounter for closed fracture: Secondary | ICD-10-CM

## 2015-11-02 DIAGNOSIS — S99922A Unspecified injury of left foot, initial encounter: Secondary | ICD-10-CM | POA: Diagnosis present

## 2015-11-02 MED ORDER — TRAMADOL HCL 50 MG PO TABS: 50.0000 mg | ORAL_TABLET | Freq: Four times a day (QID) | ORAL | 0 refills | Status: DC | PRN

## 2015-11-02 MED ORDER — NAPROXEN 500 MG PO TABS: 500.0000 mg | ORAL_TABLET | Freq: Two times a day (BID) | ORAL | 0 refills | Status: DC

## 2015-11-02 MED ORDER — HYDROCODONE-ACETAMINOPHEN 5-325 MG PO TABS
2.0000 | ORAL_TABLET | Freq: Once | ORAL | Status: AC
  Administered 2015-11-02: 2 via ORAL
  Filled 2015-11-02: qty 2

## 2015-11-02 NOTE — ED Triage Notes (Signed)
Pt reports dropping a heavy door on left big toe. Appears in severe pain at triage.

## 2015-11-02 NOTE — ED Provider Notes (Signed)
[facility]-EMERGENCY DEPT Provider Note   CSN: [id number] Arrival date & time: 11/02/15  1324  First Provider Contact:  First MD Initiated Contact with Patient 11/02/15 1411   By signing my name below, I, Janet Mccoy, attest that this documentation has been prepared under the direction and in the presence of non-physician practitioner, Santiago Glad, PA-C. Electronically Signed: Freida Mccoy, Scribe. 11/02/2015. 2:21 PM.  History   Chief Complaint Chief Complaint  Patient presents with  . Foot Pain    The history is provided by the patient. No language interpreter was used.    HPI Comments:  Janet Mccoy is a 25 y.o. female who presents to the Emergency Department complaining of 10/10, constant, left great toe pain s/p injury which occurred ~15-20 minutes PTA. Pt states she was helping move a sliding glass door when she dropped her end onto the toes. She notes mild associated numbness of the top of the right great toe. No alleviating factors noted. Pt has no other complaints or injuries at this time.    Past Medical History:  Diagnosis Date  . Headache(784.0)   . Pelvic pain in female     Patient Active Problem List   Diagnosis Date Noted  . Endometriosis 11/11/2013  . Pelvic pain in female 11/11/2013    Past Surgical History:  Procedure Laterality Date  . LAPAROSCOPY N/A 11/11/2013   Procedure: LAPAROSCOPY DIAGNOSTIC, lysis of adhesions right adenexa;  Surgeon: Meriel Pica, MD;  Location: [facility];  Service: Gynecology;  Laterality: N/A;    OB History    Gravida Para Term Preterm AB Living   0             SAB TAB Ectopic Multiple Live Births                   Home Medications    Prior to Admission medications   Medication Sig Start Date End Date Taking? Authorizing Provider  HYDROcodone-acetaminophen (NORCO) 5-325 MG per tablet Take 1-2 tablets by mouth every 6 (six) hours as needed for moderate pain. 11/11/13   Richarda Overlie, MD     Family History History reviewed. No pertinent family history.  Social History Social History  Substance Use Topics  . Smoking status: Current Every Day Smoker    Packs/day: 0.50    Years: 6.00    Types: Cigarettes  . Smokeless tobacco: Never Used  . Alcohol use Yes     Comment: RARE      Allergies   Review of patient's allergies indicates no known allergies.   Review of Systems Review of Systems  Constitutional: Negative for chills and fever.  Respiratory: Negative for shortness of breath.   Cardiovascular: Negative for chest pain.  Musculoskeletal: Positive for arthralgias and myalgias.       Right great toe     Physical Exam Updated Vital Signs BP 100/77 (BP Location: Left Arm)   Pulse 80   Temp 98.5 F (36.9 C) (Oral)   Resp 18   LMP 10/19/2015   SpO2 100%   Physical Exam  Constitutional: She is oriented to person, place, and time. She appears well-developed and well-nourished. No distress.  HENT:  Head: Normocephalic and atraumatic.  Eyes: Conjunctivae are normal.  Cardiovascular: Normal rate.   Pulses:      Dorsalis pedis pulses are 2+ on the left side.  Pulmonary/Chest: Effort normal.  Musculoskeletal:  Bruising of the left great toe and small superficial abrasion to the dorsal aspect of the  left great toe; bruising under the toenail but no subungual hematoma Distal sensation intact   Neurological: She is alert and oriented to person, place, and time.  Skin: Skin is warm and dry.  Psychiatric: She has a normal mood and affect.  Nursing note and vitals reviewed.    ED Treatments / Results  DIAGNOSTIC STUDIES:  Oxygen Saturation is 100% on RA, normal by my interpretation.    COORDINATION OF CARE:  2:18 PM Pt updated with XR results.  Discussed treatment plan with pt at bedside and pt agreed to plan.  Radiology Dg Foot Complete Left  Result Date: 11/02/2015 CLINICAL DATA:  Dropped a sliding glass door on foot at work today around 1 p.m.  Great toe pain and swelling. Initial encounter. EXAM: LEFT FOOT - COMPLETE 3+ VIEW COMPARISON:  None. FINDINGS: Comminuted fracture distal phalanx great toe without evidence for extension to the articular surface. No other fracture evident. No subluxation or dislocation. IMPRESSION: Comminuted fracture involving the distal phalanx great toe. Electronically Signed   By: Kennith Center M.D.   On: 11/02/2015 14:07   Procedures Procedures   Medications Ordered in ED Medications  HYDROcodone-acetaminophen (NORCO/VICODIN) 5-325 MG per tablet 2 tablet (2 tablets Oral Given 11/02/15 1416)     Initial Impression / Assessment and Plan / ED Course  I have reviewed the triage vital signs and the nursing notes.  Pertinent labs & imaging results that were available during my care of the patient were reviewed by me and considered in my medical decision making (see chart for details).  Clinical Course      Final Clinical Impressions(s) / ED Diagnoses   Patient X-Ray showed a comminuted fracture involving the distal phalanx great toe.  Pt advised to follow up with orthopedics. Patient given post-op shoe while in ED, conservative therapy recommended and discussed. Patient will be discharged home & is agreeable with above plan. Returns precautions discussed. Pt appears safe for discharge.  Final diagnoses:  None    New Prescriptions New Prescriptions   No medications on file    I personally performed the services described in this documentation, which was scribed in my presence. The recorded information has been reviewed and is accurate.     Santiago Glad, PA-C 11/02/15 1553    Pricilla Loveless, MD 11/03/15 [phone]

## 2015-11-03 ENCOUNTER — Encounter (HOSPITAL_BASED_OUTPATIENT_CLINIC_OR_DEPARTMENT_OTHER): Payer: Self-pay

## 2015-11-03 ENCOUNTER — Telehealth (HOSPITAL_BASED_OUTPATIENT_CLINIC_OR_DEPARTMENT_OTHER): Payer: Self-pay

## 2016-05-20 ENCOUNTER — Ambulatory Visit (HOSPITAL_COMMUNITY)
Admission: EM | Admit: 2016-05-20 | Discharge: 2016-05-20 | Disposition: A | Discharge status: Home / Self Care | Payer: BLUE CROSS/BLUE SHIELD | Attending: Family Medicine | Admitting: Family Medicine

## 2016-05-20 ENCOUNTER — Encounter (HOSPITAL_COMMUNITY): Payer: Self-pay | Admitting: Emergency Medicine

## 2016-05-20 DIAGNOSIS — B9789 Other viral agents as the cause of diseases classified elsewhere: Secondary | ICD-10-CM

## 2016-05-20 DIAGNOSIS — J069 Acute upper respiratory infection, unspecified: Secondary | ICD-10-CM | POA: Diagnosis not present

## 2016-05-20 HISTORY — DX: Attention-deficit hyperactivity disorder, unspecified type: F90.9

## 2016-05-20 MED ORDER — BENZONATATE 100 MG PO CAPS: 100.0000 mg | ORAL_CAPSULE | Freq: Three times a day (TID) | ORAL | 0 refills | Status: DC

## 2016-05-20 NOTE — ED Provider Notes (Signed)
CSN: [id number]     Arrival date & time 05/20/16  1939 History   None    Chief Complaint  Patient presents with  . Cough   (Consider location/radiation/quality/duration/timing/severity/associated sxs/prior Treatment) 26 year old female presents with fever, muscle aches, body aches, congestion, headaches, and cough since Friday, denies nausea, vomiting, diarrhea, abdominal pain, has had reduced appetite, has been drinking fluids, does not smoke, no history of asthma.   The history is provided by the patient.  Cough    Past Medical History:  Diagnosis Date  . ADHD   . Headache(784.0)   . Pelvic pain in female    Past Surgical History:  Procedure Laterality Date  . LAPAROSCOPY N/A 11/11/2013   Procedure: LAPAROSCOPY DIAGNOSTIC, lysis of adhesions right adenexa;  Surgeon: Meriel Pica, MD;  Location: [facility];  Service: Gynecology;  Laterality: N/A;   History reviewed. No pertinent family history. Social History  Substance Use Topics  . Smoking status: Current Every Day Smoker    Packs/day: 0.50    Years: 6.00    Types: Cigarettes  . Smokeless tobacco: Never Used  . Alcohol use Yes     Comment: RARE    OB History    Gravida Para Term Preterm AB Living   0             SAB TAB Ectopic Multiple Live Births                 Review of Systems  Reason unable to perform ROS: as covered in HPI.  Respiratory: Positive for cough.   All other systems reviewed and are negative.   Allergies  Patient has no known allergies.  Home Medications   Prior to Admission medications   Medication Sig Start Date End Date Taking? Authorizing Provider  amphetamine-dextroamphetamine (ADDERALL) 20 MG tablet Take 20 mg by mouth daily.   Yes Historical Provider, MD  benzonatate (TESSALON) 100 MG capsule Take 1 capsule (100 mg total) by mouth every 8 (eight) hours. 05/20/16   Dorena Bodo, NP   Meds Ordered and Administered this Visit  Medications - No data to  display  BP 116/61 (BP Location: Right Arm)   Pulse 107   Temp 98.7 F (37.1 C) (Oral)   Resp 16   SpO2 100%  No data found.   Physical Exam  Constitutional: She is oriented to person, place, and time. She appears well-developed and well-nourished. She appears ill. No distress.  HENT:  Head: Normocephalic and atraumatic.  Right Ear: Tympanic membrane and external ear normal.  Left Ear: Tympanic membrane and external ear normal.  Nose: Nose normal. Right sinus exhibits no maxillary sinus tenderness and no frontal sinus tenderness. Left sinus exhibits no maxillary sinus tenderness and no frontal sinus tenderness.  Mouth/Throat: Uvula is midline, oropharynx is clear and moist and mucous membranes are normal. No oropharyngeal exudate. Tonsils are 0 on the right. Tonsils are 0 on the left. No tonsillar exudate.  Eyes: Pupils are equal, round, and reactive to light.  Neck: Normal range of motion. Neck supple. No JVD present.  Cardiovascular: Normal rate and regular rhythm.   Pulmonary/Chest: Effort normal and breath sounds normal. No respiratory distress. She has no wheezes.  Abdominal: Soft. Bowel sounds are normal. She exhibits no distension. There is no tenderness. There is no guarding.  Lymphadenopathy:       Head (right side): No submental, no submandibular and no tonsillar adenopathy present.       Head (  left side): No submental, no submandibular and no tonsillar adenopathy present.    She has no cervical adenopathy.  Neurological: She is alert and oriented to person, place, and time.  Skin: Skin is warm and dry. Capillary refill takes less than 2 seconds. She is not diaphoretic.  Psychiatric: She has a normal mood and affect.  Nursing note and vitals reviewed.   Urgent Care Course     Procedures (including critical care time)  Labs Review Labs Reviewed - No data to display  Imaging Review No results found.   Visual Acuity Review  Right Eye Distance:   Left Eye  Distance:   Bilateral Distance:    Right Eye Near:   Left Eye Near:    Bilateral Near:         MDM   1. Viral URI with cough   You most likely have a viral URI, I advise rest, plenty of fluids and management of symptoms with over the counter medicines. For symptoms you may take Tylenol as needed every 4-6 hours for body aches or fever, not to exceed 4,000 mg a day, Take mucinex or mucinex DM ever 12 hours with a full glass of water, you may use an inhaled steroid such as Flonase, 2 sprays each nostril once a day for congestion, or an antihistamine such as Claritin or Zyrtec once a day. For cough, I have prescribed a medication called Tessalon. Take 1 tablet every 8 hours as needed for your cough. Should your symptoms worsen or fail to resolve, follow up with your primary care provider or return to clinic.       Dorena BodoLawrence Rehan Holness, NP 05/20/16 2132

## 2016-05-20 NOTE — ED Triage Notes (Signed)
[facility] a [facility] cough, headache and body aches x 4 days.

## 2016-05-20 NOTE — Discharge Instructions (Signed)
You most likely have a viral URI, I advise rest, plenty of fluids and management of symptoms with over the counter medicines. For symptoms you may take Tylenol as needed every 4-6 hours for body aches or fever, not to exceed 4,000 mg a day, Take mucinex or mucinex DM ever 12 hours with a full glass of water, you may use an inhaled steroid such as Flonase, 2 sprays each nostril once a day for congestion, or an antihistamine such as Claritin or Zyrtec once a day. For cough, I have prescribed a medication called Tessalon. Take 1 tablet every 8 hours as needed for your cough. Should your symptoms worsen or fail to resolve, follow up with your primary care provider or return to clinic.  °

## 2016-10-06 DIAGNOSIS — M76822 Posterior tibial tendinitis, left leg: Secondary | ICD-10-CM

## 2016-10-06 HISTORY — DX: Posterior tibial tendinitis, left leg: M76.822

## 2016-10-25 ENCOUNTER — Encounter (HOSPITAL_BASED_OUTPATIENT_CLINIC_OR_DEPARTMENT_OTHER): Payer: Self-pay | Admitting: *Deleted

## 2016-10-25 ENCOUNTER — Other Ambulatory Visit: Payer: Self-pay | Admitting: Orthopedic Surgery

## 2016-10-29 ENCOUNTER — Encounter (HOSPITAL_BASED_OUTPATIENT_CLINIC_OR_DEPARTMENT_OTHER): Payer: Self-pay | Admitting: *Deleted

## 2016-10-31 ENCOUNTER — Ambulatory Visit (HOSPITAL_BASED_OUTPATIENT_CLINIC_OR_DEPARTMENT_OTHER): Payer: Worker's Compensation | Admitting: Certified Registered"

## 2016-10-31 ENCOUNTER — Encounter (HOSPITAL_BASED_OUTPATIENT_CLINIC_OR_DEPARTMENT_OTHER): Payer: Self-pay | Admitting: Certified Registered"

## 2016-10-31 ENCOUNTER — Ambulatory Visit (HOSPITAL_BASED_OUTPATIENT_CLINIC_OR_DEPARTMENT_OTHER)
Admission: RE | Admit: 2016-10-31 | Discharge: 2016-10-31 | Disposition: A | Discharge status: Home / Self Care | Payer: Worker's Compensation | Source: Ambulatory Visit | Attending: Orthopedic Surgery | Admitting: Orthopedic Surgery

## 2016-10-31 ENCOUNTER — Encounter (HOSPITAL_BASED_OUTPATIENT_CLINIC_OR_DEPARTMENT_OTHER)
Admission: RE | Disposition: A | Discharge status: Home / Self Care | Payer: Self-pay | Source: Ambulatory Visit | Attending: Orthopedic Surgery

## 2016-10-31 DIAGNOSIS — X58XXXA Exposure to other specified factors, initial encounter: Secondary | ICD-10-CM | POA: Diagnosis not present

## 2016-10-31 DIAGNOSIS — Z87891 Personal history of nicotine dependence: Secondary | ICD-10-CM | POA: Diagnosis not present

## 2016-10-31 DIAGNOSIS — M65872 Other synovitis and tenosynovitis, left ankle and foot: Secondary | ICD-10-CM | POA: Insufficient documentation

## 2016-10-31 DIAGNOSIS — F329 Major depressive disorder, single episode, unspecified: Secondary | ICD-10-CM | POA: Insufficient documentation

## 2016-10-31 DIAGNOSIS — F909 Attention-deficit hyperactivity disorder, unspecified type: Secondary | ICD-10-CM | POA: Insufficient documentation

## 2016-10-31 DIAGNOSIS — F419 Anxiety disorder, unspecified: Secondary | ICD-10-CM | POA: Diagnosis not present

## 2016-10-31 DIAGNOSIS — S93422A Sprain of deltoid ligament of left ankle, initial encounter: Secondary | ICD-10-CM | POA: Insufficient documentation

## 2016-10-31 DIAGNOSIS — M76822 Posterior tibial tendinitis, left leg: Secondary | ICD-10-CM | POA: Diagnosis not present

## 2016-10-31 HISTORY — DX: Anxiety disorder, unspecified: F41.9

## 2016-10-31 HISTORY — DX: Endometriosis, unspecified: N80.9

## 2016-10-31 HISTORY — PX: TENOLYSIS: SHX396

## 2016-10-31 HISTORY — DX: Posterior tibial tendinitis, left leg: M76.822

## 2016-10-31 LAB — POCT HEMOGLOBIN-HEMACUE: HEMOGLOBIN: 12.5 g/dL (ref 12.0–15.0)

## 2016-10-31 SURGERY — INCISION, TENDON SHEATH
Anesthesia: General | Site: Ankle | Laterality: Left

## 2016-10-31 MED ORDER — CEFAZOLIN SODIUM-DEXTROSE 2-4 GM/100ML-% IV SOLN
INTRAVENOUS | Status: AC
  Filled 2016-10-31: qty 100

## 2016-10-31 MED ORDER — HYDROCODONE-ACETAMINOPHEN 5-325 MG PO TABS
1.0000 | ORAL_TABLET | Freq: Once | ORAL | Status: AC | PRN
  Administered 2016-10-31: 1 via ORAL

## 2016-10-31 MED ORDER — FENTANYL CITRATE (PF) 100 MCG/2ML IJ SOLN
INTRAMUSCULAR | Status: AC
  Filled 2016-10-31: qty 2

## 2016-10-31 MED ORDER — BUPIVACAINE-EPINEPHRINE 0.5% -1:200000 IJ SOLN
INTRAMUSCULAR | Status: DC | PRN
  Administered 2016-10-31: 13 mL

## 2016-10-31 MED ORDER — DOCUSATE SODIUM 100 MG PO CAPS: 100.0000 mg | ORAL_CAPSULE | Freq: Two times a day (BID) | ORAL | 0 refills | Status: DC

## 2016-10-31 MED ORDER — LACTATED RINGERS IV SOLN
INTRAVENOUS | Status: DC
  Administered 2016-10-31 (×2): via INTRAVENOUS

## 2016-10-31 MED ORDER — HYDROCODONE-ACETAMINOPHEN 5-325 MG PO TABS: 1.0000 | ORAL_TABLET | ORAL | 0 refills | Status: DC | PRN

## 2016-10-31 MED ORDER — MIDAZOLAM HCL 2 MG/2ML IJ SOLN
1.0000 mg | INTRAMUSCULAR | Status: DC | PRN
  Administered 2016-10-31: 2 mg via INTRAVENOUS

## 2016-10-31 MED ORDER — KETOROLAC TROMETHAMINE 10 MG PO TABS: 10.0000 mg | ORAL_TABLET | Freq: Four times a day (QID) | ORAL | 0 refills | Status: DC | PRN

## 2016-10-31 MED ORDER — SODIUM CHLORIDE 0.9 % IV SOLN: INTRAVENOUS | Status: DC

## 2016-10-31 MED ORDER — CHLORHEXIDINE GLUCONATE 4 % EX LIQD: 60.0000 mL | Freq: Once | CUTANEOUS | Status: DC

## 2016-10-31 MED ORDER — PROPOFOL 10 MG/ML IV BOLUS
INTRAVENOUS | Status: DC | PRN
  Administered 2016-10-31: 150 mg via INTRAVENOUS

## 2016-10-31 MED ORDER — SCOPOLAMINE 1 MG/3DAYS TD PT72: 1.0000 | MEDICATED_PATCH | Freq: Once | TRANSDERMAL | Status: DC | PRN

## 2016-10-31 MED ORDER — DEXAMETHASONE SODIUM PHOSPHATE 10 MG/ML IJ SOLN
INTRAMUSCULAR | Status: DC | PRN
  Administered 2016-10-31: 10 mg via INTRAVENOUS

## 2016-10-31 MED ORDER — MIDAZOLAM HCL 2 MG/2ML IJ SOLN
INTRAMUSCULAR | Status: AC
  Filled 2016-10-31: qty 2

## 2016-10-31 MED ORDER — ONDANSETRON HCL 4 MG/2ML IJ SOLN
INTRAMUSCULAR | Status: DC | PRN
  Administered 2016-10-31: 4 mg via INTRAVENOUS

## 2016-10-31 MED ORDER — FENTANYL CITRATE (PF) 100 MCG/2ML IJ SOLN
25.0000 ug | INTRAMUSCULAR | Status: DC | PRN
  Administered 2016-10-31: 50 ug via INTRAVENOUS

## 2016-10-31 MED ORDER — HYDROCODONE-ACETAMINOPHEN 5-325 MG PO TABS
ORAL_TABLET | ORAL | Status: AC
  Filled 2016-10-31: qty 1

## 2016-10-31 MED ORDER — CEFAZOLIN SODIUM-DEXTROSE 2-4 GM/100ML-% IV SOLN
2.0000 g | INTRAVENOUS | Status: AC
  Administered 2016-10-31: 2 g via INTRAVENOUS

## 2016-10-31 MED ORDER — BUPIVACAINE-EPINEPHRINE (PF) 0.5% -1:200000 IJ SOLN
INTRAMUSCULAR | Status: DC | PRN
  Administered 2016-10-31: 30 mL via PERINEURAL

## 2016-10-31 MED ORDER — PROMETHAZINE HCL 25 MG/ML IJ SOLN: 6.2500 mg | INTRAMUSCULAR | Status: DC | PRN

## 2016-10-31 MED ORDER — LIDOCAINE HCL (CARDIAC) 20 MG/ML IV SOLN
INTRAVENOUS | Status: DC | PRN
  Administered 2016-10-31: 50 mg via INTRAVENOUS
  Administered 2016-10-31: 30 mg via INTRAVENOUS

## 2016-10-31 MED ORDER — ENOXAPARIN SODIUM 40 MG/0.4ML ~~LOC~~ SOLN: 40.0000 mg | SUBCUTANEOUS | 0 refills | Status: DC

## 2016-10-31 MED ORDER — SENNA 8.6 MG PO TABS: 2.0000 | ORAL_TABLET | Freq: Two times a day (BID) | ORAL | 0 refills | Status: DC

## 2016-10-31 MED ORDER — FENTANYL CITRATE (PF) 100 MCG/2ML IJ SOLN
50.0000 ug | INTRAMUSCULAR | Status: DC | PRN
  Administered 2016-10-31 (×2): 50 ug via INTRAVENOUS

## 2016-10-31 SURGICAL SUPPLY — 60 items
BANDAGE ESMARK 6X9 LF (GAUZE/BANDAGES/DRESSINGS) ×1 IMPLANT
BLADE SURG 15 STRL LF DISP TIS (BLADE) ×2 IMPLANT
BLADE SURG 15 STRL SS (BLADE) ×6
BNDG COHESIVE 4X5 TAN STRL (GAUZE/BANDAGES/DRESSINGS) ×3 IMPLANT
BNDG COHESIVE 6X5 TAN STRL LF (GAUZE/BANDAGES/DRESSINGS) ×3 IMPLANT
BNDG ESMARK 4X9 LF (GAUZE/BANDAGES/DRESSINGS) IMPLANT
BNDG ESMARK 6X9 LF (GAUZE/BANDAGES/DRESSINGS) ×3
CHLORAPREP W/TINT 26ML (MISCELLANEOUS) ×6 IMPLANT
COVER BACK TABLE 60X90IN (DRAPES) ×3 IMPLANT
CUFF TOURNIQUET SINGLE 34IN LL (TOURNIQUET CUFF) ×3 IMPLANT
DECANTER SPIKE VIAL GLASS SM (MISCELLANEOUS) IMPLANT
DRAPE C-ARMOR (DRAPES) IMPLANT
DRAPE EXTREMITY T 121X128X90 (DRAPE) ×3 IMPLANT
DRAPE OEC MINIVIEW 54X84 (DRAPES) IMPLANT
DRAPE U-SHAPE 47X51 STRL (DRAPES) IMPLANT
DRSG MEPITEL 4X7.2 (GAUZE/BANDAGES/DRESSINGS) ×3 IMPLANT
DRSG PAD ABDOMINAL 8X10 ST (GAUZE/BANDAGES/DRESSINGS) ×6 IMPLANT
ELECT REM PT RETURN 9FT ADLT (ELECTROSURGICAL) ×3
ELECTRODE REM PT RTRN 9FT ADLT (ELECTROSURGICAL) ×1 IMPLANT
GAUZE SPONGE 4X4 12PLY STRL (GAUZE/BANDAGES/DRESSINGS) ×3 IMPLANT
GLOVE BIO SURGEON STRL SZ7 (GLOVE) ×6 IMPLANT
GLOVE BIO SURGEON STRL SZ8 (GLOVE) ×3 IMPLANT
GLOVE BIOGEL PI IND STRL 8 (GLOVE) ×2 IMPLANT
GLOVE BIOGEL PI INDICATOR 8 (GLOVE) ×4
GLOVE ECLIPSE 6.5 STRL STRAW (GLOVE) ×3 IMPLANT
GLOVE ECLIPSE 8.0 STRL XLNG CF (GLOVE) ×3 IMPLANT
GOWN STRL REUS W/ TWL LRG LVL3 (GOWN DISPOSABLE) ×1 IMPLANT
GOWN STRL REUS W/ TWL XL LVL3 (GOWN DISPOSABLE) ×2 IMPLANT
GOWN STRL REUS W/TWL LRG LVL3 (GOWN DISPOSABLE) ×2
GOWN STRL REUS W/TWL XL LVL3 (GOWN DISPOSABLE) ×6
NEEDLE HYPO 22GX1.5 SAFETY (NEEDLE) ×3 IMPLANT
NS IRRIG 1000ML POUR BTL (IV SOLUTION) ×3 IMPLANT
PACK BASIN DAY SURGERY FS (CUSTOM PROCEDURE TRAY) ×3 IMPLANT
PAD CAST 4YDX4 CTTN HI CHSV (CAST SUPPLIES) ×1 IMPLANT
PADDING CAST ABS 4INX4YD NS (CAST SUPPLIES)
PADDING CAST ABS COTTON 4X4 ST (CAST SUPPLIES) IMPLANT
PADDING CAST COTTON 4X4 STRL (CAST SUPPLIES) ×3
PADDING CAST COTTON 6X4 STRL (CAST SUPPLIES) ×3 IMPLANT
PENCIL BUTTON HOLSTER BLD 10FT (ELECTRODE) ×3 IMPLANT
SANITIZER HAND PURELL 535ML FO (MISCELLANEOUS) ×3 IMPLANT
SHEET MEDIUM DRAPE 40X70 STRL (DRAPES) ×3 IMPLANT
SLEEVE SCD COMPRESS KNEE MED (MISCELLANEOUS) ×3 IMPLANT
SPLINT FAST PLASTER 5X30 (CAST SUPPLIES) ×40
SPLINT PLASTER CAST FAST 5X30 (CAST SUPPLIES) ×20 IMPLANT
SPONGE LAP 18X18 X RAY DECT (DISPOSABLE) ×3 IMPLANT
STOCKINETTE 6  STRL (DRAPES) ×2
STOCKINETTE 6 STRL (DRAPES) ×1 IMPLANT
SUCTION FRAZIER HANDLE 10FR (MISCELLANEOUS) ×2
SUCTION TUBE FRAZIER 10FR DISP (MISCELLANEOUS) ×1 IMPLANT
SUT ETHILON 3 0 PS 1 (SUTURE) ×3 IMPLANT
SUT MNCRL AB 3-0 PS2 18 (SUTURE) ×3 IMPLANT
SUT MNCRL AB 4-0 PS2 18 (SUTURE) IMPLANT
SUT VIC AB 0 SH 27 (SUTURE) ×3 IMPLANT
SUT VIC AB 2-0 SH 27 (SUTURE) ×3
SUT VIC AB 2-0 SH 27XBRD (SUTURE) ×1 IMPLANT
SYR BULB 3OZ (MISCELLANEOUS) ×3 IMPLANT
TOWEL OR 17X24 6PK STRL BLUE (TOWEL DISPOSABLE) ×3 IMPLANT
TUBE CONNECTING 20'X1/4 (TUBING) ×1
TUBE CONNECTING 20X1/4 (TUBING) ×2 IMPLANT
UNDERPAD 30X30 (UNDERPADS AND DIAPERS) ×3 IMPLANT

## 2016-10-31 NOTE — Progress Notes (Signed)
AssistedDr. Turk with left, ultrasound guided, adductor canal block. Side rails up, monitors on throughout procedure. See vital signs in flow sheet. Tolerated Procedure well.  

## 2016-10-31 NOTE — Discharge Instructions (Addendum)
Janet ArthursJohn Hewitt, MD [facility]  Please read the following information regarding your care after surgery.  Medications  You only need a prescription for the narcotic pain medicine (ex. oxycodone, Percocet, Norco).  All of the other medicines listed below are available over the counter. X hydrocodone as prescribed for moderate to severe pain X ketorolac as prescribed, with food, as needed for pain  Narcotic pain medicine (ex. oxycodone, Percocet, Vicodin) will cause constipation.  To prevent this problem, take the following medicines while you are taking any pain medicine. X docusate sodium (Colace) 100 mg twice a day X senna (Senokot) 2 tablets twice a day  X To help prevent blood clots, inject lovenox subcutaneously once a day for two weeks after surgery.  You should also get up every hour while you are awake to move around.    Weight Bearing X Do not bear any weight on the operated leg or foot.  Cast / Splint / Dressing X Keep your splint, cast or dressing clean and dry.  Dont put anything (coat hanger, pencil, etc) down inside of it.  If it gets damp, use a hair dryer on the cool setting to dry it.  If it gets soaked, call the office to schedule an appointment for a cast change.   After your dressing, cast or splint is removed; you may shower, but do not soak or scrub the wound.  Allow the water to run over it, and then gently pat it dry.  Swelling It is normal for you to have swelling where you had surgery.  To reduce swelling and pain, keep your toes above your nose for at least 3 days after surgery.  It may be necessary to keep your foot or leg elevated for several weeks.  If it hurts, it should be elevated.  Follow Up Call my office at [phone] when you are discharged from the hospital or surgery center to schedule an appointment to be seen two weeks after surgery.  Call my office at [phone] if you develop a fever >101.5 F, nausea, vomiting, bleeding from the  surgical site or severe pain.     Post Anesthesia Home Care Instructions  Activity: Get plenty of rest for the remainder of the day. A responsible individual must stay with you for 24 hours following the procedure.  For the next 24 hours, DO NOT: -Drive a car -Advertising copywriterperate machinery -Drink alcoholic beverages -Take any medication unless instructed by your physician -Make any legal decisions or sign important papers.  Meals: Start with liquid foods such as gelatin or soup. Progress to regular foods as tolerated. Avoid greasy, spicy, heavy foods. If nausea and/or vomiting occur, drink only clear liquids until the nausea and/or vomiting subsides. Call your physician if vomiting continues.  Special Instructions/Symptoms: Your throat may feel dry or sore from the anesthesia or the breathing tube placed in your throat during surgery. If this causes discomfort, gargle with warm salt water. The discomfort should disappear within 24 hours.  If you had a scopolamine patch placed behind your ear for the management of post- operative nausea and/or vomiting:  1. The medication in the patch is effective for 72 hours, after which it should be removed.  Wrap patch in a tissue and discard in the trash. Wash hands thoroughly with soap and water. 2. You may remove the patch earlier than 72 hours if you experience unpleasant side effects which may include dry mouth, dizziness or visual disturbances. 3. Avoid touching the patch. Wash your hands  with soap and water after contact with the patch.   Regional Anesthesia Blocks  1. Numbness or the inability to move the "blocked" extremity may last from 3-48 hours after placement. The length of time depends on the medication injected and your individual response to the medication. If the numbness is not going away after 48 hours, call your surgeon.  2. The extremity that is blocked will need to be protected until the numbness is gone and the  Strength has returned.  Because you cannot feel it, you will need to take extra care to avoid injury. Because it may be weak, you may have difficulty moving it or using it. You may not know what position it is in without looking at it while the block is in effect.  3. For blocks in the legs and feet, returning to weight bearing and walking needs to be done carefully. You will need to wait until the numbness is entirely gone and the strength has returned. You should be able to move your leg and foot normally before you try and bear weight or walk. You will need someone to be with you when you first try to ensure you do not fall and possibly risk injury.  4. Bruising and tenderness at the needle site are common side effects and will resolve in a few days.  5. Persistent numbness or new problems with movement should be communicated to the surgeon or the [facility] [phone])/ [facility] [phone]).

## 2016-10-31 NOTE — H&P (Signed)
Janet Mccoy is an 26 y.o. female.   Chief Complaint:  Left ankle pain HPI:  26 y/o female with left medial ankle pain from posterior tibial tendonitis.  She has failed non op treatment including PT, bracing and NSAIDs.  She presents now for posterior tibial tenolysis.  Past Medical History:  Diagnosis Date  . ADHD   . Anxiety   . Depression   . Endometriosis   . Posterior tibial tendinitis of left lower extremity 10/2016    Past Surgical History:  Procedure Laterality Date  . LAPAROSCOPY N/A 11/11/2013   Procedure: LAPAROSCOPY DIAGNOSTIC, lysis of adhesions right adenexa;  Surgeon: Meriel Picaichard M Holland, MD;  [facility] ;  Service: Gynecology;  Laterality: N/A;    History reviewed. No pertinent family history. Social History:  reports that she quit smoking about a year ago. She smoked 0.00 packs per day for 6.00 years. She has never used smokeless tobacco. She reports that she drinks alcohol. She reports that she does not use drugs.  Allergies: No Known Allergies  Medications Prior to Admission  Medication Sig Dispense Refill  . Amphetamine-Dextroamphetamine (AMPHETAMINE SALT COMBO PO) Take 40 mg by mouth.    . citalopram (CELEXA) 10 MG tablet Take 10 mg by mouth daily.    Marland Kitchen. doxycycline (ADOXA) 100 MG tablet Take 100 mg by mouth 2 (two) times daily.      Results for orders placed or performed during the hospital encounter of 10/31/16 (from the past 48 hour(s))  Hemoglobin-hemacue, POC     Status: None   Collection Time: 10/31/16  8:02 AM  Result Value Ref Range   Hemoglobin 12.5 12.0 - 15.0 g/dL   No results found.  ROS  No recent f/c/n/v/wtloss  Blood pressure 127/68, pulse 92, temperature 98.4 F (36.9 C), temperature source Oral, resp. rate 18, height 5\' 7"  (1.702 m), weight 76.7 kg (169 lb), last menstrual period 10/14/2016, SpO2 100 %. Physical Exam  wn wd woman in nad.  A and  O x 4.  Mood and affect normal.  EOMI.  resp unlabored.  L ankle  with TTP along the post tibial tendon.  Able to do a single heel rise.  No lymphadenopthy.  5/5 stfrength at the PTT.    Assessment/Plan Stage 1 PTTD and left ankle pain - to OR for left posterior tibial tenolysis.  The risks and benefits of the alternative treatment options have been discussed in detail.  The patient wishes to proceed with surgery and specifically understands risks of bleeding, infection, nerve damage, blood clots, need for additional surgery, amputation and death.   Toni ArthursHEWITT, Marieann Zipp, MD 10/31/2016, 8:42 AM

## 2016-10-31 NOTE — Anesthesia Procedure Notes (Signed)
Procedure Name: LMA Insertion Date/Time: 10/31/2016 9:02 AM Performed by: Wahneta Derocher D Pre-anesthesia Checklist: Patient identified, Emergency Drugs available, Suction available and Patient being monitored Patient Re-evaluated:Patient Re-evaluated prior to induction Oxygen Delivery Method: Circle system utilized Preoxygenation: Pre-oxygenation with 100% oxygen Induction Type: IV induction Ventilation: Mask ventilation without difficulty LMA: LMA inserted LMA Size: 3.0 Number of attempts: 1 Airway Equipment and Method: Bite block Placement Confirmation: positive ETCO2 Tube secured with: Tape Dental Injury: Teeth and Oropharynx as per pre-operative assessment

## 2016-10-31 NOTE — Anesthesia Preprocedure Evaluation (Signed)
Anesthesia Evaluation  Patient identified by MRN, date of birth, ID band Patient awake    Reviewed: Allergy & Precautions, NPO status , Patient's Chart, lab work & pertinent test results  Airway Mallampati: II  TM Distance: >3 FB Neck ROM: Full    Dental  (+) Teeth Intact, Dental Advisory Given   Pulmonary former smoker,    Pulmonary exam normal breath sounds clear to auscultation       Cardiovascular negative cardio ROS Normal cardiovascular exam Rhythm:Regular Rate:Normal     Neuro/Psych PSYCHIATRIC DISORDERS Anxiety Depression negative neurological ROS     GI/Hepatic negative GI ROS, Neg liver ROS,   Endo/Other  negative endocrine ROS  Renal/GU negative Renal ROS     Musculoskeletal negative musculoskeletal ROS (+)   Abdominal   Peds  (+) ADHD Hematology negative hematology ROS (+)   Anesthesia Other Findings Day of surgery medications reviewed with the patient.  Reproductive/Obstetrics negative OB ROS                             Anesthesia Physical Anesthesia Plan  ASA: II  Anesthesia Plan: General   Post-op Pain Management:  Regional for Post-op pain   Induction: Intravenous  PONV Risk Score and Plan: 3 and Ondansetron, Dexamethasone and Midazolam  Airway Management Planned: LMA  Additional Equipment:   Intra-op Plan:   Post-operative Plan: Extubation in OR  Informed Consent: I have reviewed the patients History and Physical, chart, labs and discussed the procedure including the risks, benefits and alternatives for the proposed anesthesia with the patient or authorized representative who has indicated his/her understanding and acceptance.   Dental advisory given  Plan Discussed with: CRNA  Anesthesia Plan Comments: (Risks/benefits of general anesthesia discussed with patient including risk of damage to teeth, lips, gum, and tongue, nausea/vomiting, allergic  reactions to medications, and the possibility of heart attack, stroke and death.  All patient questions answered.  Patient wishes to proceed.)        Anesthesia Quick Evaluation

## 2016-10-31 NOTE — Op Note (Signed)
10/31/2016  9:47 AM  PATIENT:  Janet Mccoy  26 y.o. female  PRE-OPERATIVE DIAGNOSIS:  Left posterior tibial tendonitis   POST-OPERATIVE DIAGNOSIS: 1.  Left posterior tibial tendonitis       2.  Left deltoid ligament tear  Procedure(s): 1.  Left posterior tibial tendon tenolysis 2.  Left deltoid ligament secondary repair  SURGEON:  Toni ArthursJohn Mikolaj Woolstenhulme, MD  ASSISTANT: Alfredo MartinezJustin Ollis, PA-C  ANESTHESIA:   General, regional  EBL:  minimal   TOURNIQUET:   Total Tourniquet Time Documented: Thigh (Left) - 21 minutes Total: Thigh (Left) - 21 minutes  COMPLICATIONS:  None apparent  DISPOSITION:  Extubated, awake and stable to recovery.  PROCEDURE IN DETAIL:  After pre operative consent was obtained, and the correct operative site was identified, the patient was brought to the operating room and placed supine on the OR table.  Anesthesia was administered.  Pre-operative antibiotics were administered.  A surgical timeout was taken.  The left lower extremity was then prepped and draped in standard sterile fashion. The extremity was exsanguinated and the tourniquet was inflated to 250 mmHg. A curvilinear incision was then made over the medial ankle along the course of posterior tibial tendon.  Sharp dissection was carried down through the skin and subcutaneous tissue to the superficial fascia. Fascia was incised and posterior tibial tendon was identified.  Careful tenolysis was then performed along the course of the posterior tibial tendon proximally and distally after releasing the tendon sheath appropriately. There was moderate tenosynovitis around the tendon. Once this was sharply excised the deltoid ligament and spring ligament were carefully inspected. A small tear was identified at the distal extent of the deltoid ligament where it transitions into the spring ligament. This was debrided of fibrous tissue creating to healthy-appearing edges of ligament. 0 Vicryl sutures were used to imbricate the  edges of the ligament repairing the tear appropriately.    The wound was irrigated copiously. The tendon was reduced to its appropriate position and the tendon sheath. Tendon sheath was repaired simple sutures of 2-0 Vicryl. Subcutaneous tenderness tissues were repaired approximated with inverted simple sutures of 3-0 Monocryl skin incision was closed with running 3-0 nylon sterile dressings were applied followed by well-padded short leg splint tourniquet was released after application of the dressings at approximately 20 minutes patient was awakened from anesthesia and transported to the recovery room in stable condition.  FOLLOW UP PLAN: Nonweightbearing on the left lower extremity in a short leg cast. Lovenox for DVT prophylaxis. Follow up with me in the office in 2 weeks for suture removal and conversion to a short leg cast. She'll be nonweightbearing for 6 weeks postop due to ligament repair.    Alfredo MartinezJustin Ollis PA-C was present and scrubbed for the duration of the operative case. His assistance assistance was essential in positioning the patient, prepping and draping, gaining maintaining exposure, performing the operation, closing and dressing the wounds and applying the splint.

## 2016-10-31 NOTE — Anesthesia Postprocedure Evaluation (Signed)
Anesthesia Post Note  Patient: Janet ArenaWhittney Mccoy  Procedure(s) Performed: Procedure(s) (LRB): Left posterior tibial tendon tenolysis (Left)     Patient location during evaluation: PACU Anesthesia Type: General Level of consciousness: awake and alert Pain management: pain level controlled Vital Signs Assessment: post-procedure vital signs reviewed and stable Respiratory status: spontaneous breathing, nonlabored ventilation, respiratory function stable and patient connected to nasal cannula oxygen Cardiovascular status: blood pressure returned to baseline and stable Postop Assessment: no signs of nausea or vomiting Anesthetic complications: no    Last Vitals:  Vitals:   10/31/16 1045 10/31/16 1113  BP: 122/74 127/76  Pulse: 95 77  Resp: 17 18  Temp:  36.8 C    Last Pain:  Vitals:   10/31/16 1113  TempSrc:   PainSc: 3     LLE Motor Response: Purposeful movement (10/31/16 1113) LLE Sensation: Decreased (10/31/16 1113)          Cecile HearingStephen Edward Turk

## 2016-10-31 NOTE — Anesthesia Procedure Notes (Signed)
Anesthesia Regional Block: Adductor canal block   Pre-Anesthetic Checklist: ,, timeout performed, Correct Patient, Correct Site, Correct Laterality, Correct Procedure, Correct Position, site marked, Risks and benefits discussed,  Surgical consent,  Pre-op evaluation,  At surgeon's request and post-op pain management  Laterality: Left  Prep: chloraprep       Needles:  Injection technique: Single-shot  Needle Type: Echogenic Needle     Needle Length: 9cm  Needle Gauge: 21     Additional Needles:   Procedures: ultrasound guided,,,,,,,,  Narrative:  Start time: 10/31/2016 8:02 AM End time: 10/31/2016 8:07 AM Injection made incrementally with aspirations every 5 mL.  Performed by: Personally  Anesthesiologist: Cecile HearingURK, Alija Riano EDWARD  Additional Notes: No pain on injection. No increased resistance to injection. Injection made in 5cc increments.  Good needle visualization.  Patient tolerated procedure well.

## 2016-10-31 NOTE — Transfer of Care (Signed)
Immediate Anesthesia Transfer of Care Note  Patient: Janet Mccoy  Procedure(s) Performed: Procedure(s): Left posterior tibial tendon tenolysis (Left)  Patient Location: PACU  Anesthesia Type:GA combined with regional for post-op pain  Level of Consciousness: awake and patient cooperative  Airway & Oxygen Therapy: Patient Spontanous Breathing and Patient connected to face mask oxygen  Post-op Assessment: Report given to RN and Post -op Vital signs reviewed and stable  Post vital signs: Reviewed and stable  Last Vitals:  Vitals:   10/31/16 0815 10/31/16 0820  BP: 127/68   Pulse: (!) 104 92  Resp: 20 18  Temp:      Last Pain:  Vitals:   10/31/16 0741  TempSrc: Oral  PainSc: 3       Patients Stated Pain Goal: 1 (10/31/16 0741)  Complications: No apparent anesthesia complications

## 2016-11-01 ENCOUNTER — Encounter (HOSPITAL_BASED_OUTPATIENT_CLINIC_OR_DEPARTMENT_OTHER): Payer: Self-pay | Admitting: Orthopedic Surgery

## 2019-06-22 ENCOUNTER — Other Ambulatory Visit: Payer: Self-pay

## 2019-06-22 ENCOUNTER — Encounter (HOSPITAL_COMMUNITY): Payer: Self-pay

## 2019-06-22 ENCOUNTER — Ambulatory Visit (HOSPITAL_COMMUNITY)
Admission: EM | Admit: 2019-06-22 | Discharge: 2019-06-22 | Disposition: A | Discharge status: Home / Self Care | Payer: BLUE CROSS/BLUE SHIELD | Attending: Family Medicine | Admitting: Family Medicine

## 2019-06-22 DIAGNOSIS — R21 Rash and other nonspecific skin eruption: Secondary | ICD-10-CM

## 2019-06-22 DIAGNOSIS — L255 Unspecified contact dermatitis due to plants, except food: Secondary | ICD-10-CM

## 2019-06-22 MED ORDER — DEXAMETHASONE SODIUM PHOSPHATE 10 MG/ML IJ SOLN
INTRAMUSCULAR | Status: AC
  Filled 2019-06-22: qty 1

## 2019-06-22 MED ORDER — DEXAMETHASONE SODIUM PHOSPHATE 10 MG/ML IJ SOLN
10.0000 mg | Freq: Once | INTRAMUSCULAR | Status: AC
  Administered 2019-06-22: 12:00:00 10 mg via INTRAMUSCULAR

## 2019-06-22 MED ORDER — CLOBETASOL PROPIONATE 0.05 % EX GEL: CUTANEOUS | 0 refills | Status: DC

## 2019-06-22 MED ORDER — PREDNISONE 10 MG (21) PO TBPK: ORAL_TABLET | Freq: Every day | ORAL | 0 refills | Status: AC

## 2019-06-22 NOTE — ED Provider Notes (Signed)
[facility]    CSN: [id number] Arrival date & time: 06/22/19  1028      History   Chief Complaint Chief Complaint  Patient presents with  . Rash    HPI Janet Mccoy is a 29 y.o. female.   Patient states that she was exposed to some type of poison, ivy or oak she is not sure.  She was helping her dad in the woods 4 days ago.  She has developed a rash to forearms, that has also spread to her face, reports rash is also to left flank, left ear.  Reports the rash is very itchy.  Reports that she has used, lotion, cortisone cream, oatmeal bath with no relief.  States this is never happened to her before.  Denies headache, shortness of breath, sore throat, nausea, vomiting, diarrhea, other symptoms.  ROS per HPI  The history is provided by the patient.    Past Medical History:  Diagnosis Date  . ADHD   . Anxiety   . Depression   . Endometriosis   . Posterior tibial tendinitis of left lower extremity 10/2016    Patient Active Problem List   Diagnosis Date Noted  . Endometriosis 11/11/2013  . Pelvic pain in female 11/11/2013    Past Surgical History:  Procedure Laterality Date  . LAPAROSCOPY N/A 11/11/2013   Procedure: LAPAROSCOPY DIAGNOSTIC, lysis of adhesions right adenexa;  Surgeon: Margarette Asal, MD;  Location: [facility];  Service: Gynecology;  Laterality: N/A;  . TENOLYSIS Left 10/31/2016   Procedure: Left posterior tibial tendon tenolysis;  Surgeon: Wylene Simmer, MD;  Location: [facility];  Service: Orthopedics;  Laterality: Left;    OB History    Gravida  0   Para      Term      Preterm      AB      Living        SAB      TAB      Ectopic      Multiple      Live Births               Home Medications    Prior to Admission medications   Medication Sig Start Date End Date Taking? Authorizing Provider  Amphetamine-Dextroamphetamine (AMPHETAMINE SALT COMBO PO) Take 40 mg by mouth.    [provider]  citalopram (CELEXA) 10 MG tablet Take 10 mg by mouth daily.    [provider]  clobetasol (TEMOVATE) 0.05 % GEL Apply to affected area twice daily as needed for rash itching and burning 06/22/19   Faustino Congress, NP  docusate sodium (COLACE) 100 MG capsule Take 1 capsule (100 mg total) by mouth 2 (two) times daily. While taking narcotic pain medicine. 10/31/16   Corky Sing, PA-C  doxycycline (ADOXA) 100 MG tablet Take 100 mg by mouth 2 (two) times daily. 10/21/16   [provider]  enoxaparin (LOVENOX) 40 MG/0.4ML injection Inject 0.4 mLs (40 mg total) into the skin daily. 10/31/16   Corky Sing, PA-C  HYDROcodone-acetaminophen (NORCO/VICODIN) 5-325 MG tablet Take 1-2 tablets by mouth every 4 (four) hours as needed for moderate pain. For no more than 3 days. 10/31/16   Corky Sing, PA-C  ketorolac (TORADOL) 10 MG tablet Take 1 tablet (10 mg total) by mouth every 6 (six) hours as needed. 10/31/16   Corky Sing, PA-C  predniSONE (STERAPRED UNI-PAK 21 TAB) 10 MG (21) TBPK tablet Take  by mouth daily for 6 days. Take 6 tablets on day 1, 5 tablets on day 2, 4 tablets on day 3, 3 tablets on day 4, 2 tablets on day 5, 1 tablet on day 6 06/22/19 06/28/19  Moshe Cipro, NP  senna (SENOKOT) 8.6 MG TABS tablet Take 2 tablets (17.2 mg total) by mouth 2 (two) times daily. 10/31/16   Jacinta Shoe, PA-C    Family History No family history on file.  Social History Social History   Tobacco Use  . Smoking status: Former Smoker    Packs/day: 0.00    Years: 6.00    Pack years: 0.00    Quit date: 11/06/2015    Years since quitting: 3.6  . Smokeless tobacco: Never Used  Substance Use Topics  . Alcohol use: Yes    Comment: occasionally  . Drug use: No    Comment: AVERAGE USE 7GRAMS EVERY 2 DAYS     Allergies   Patient has no known allergies.   Review of Systems Review of Systems   Physical Exam Triage Vital Signs ED Triage  Vitals  Enc Vitals Group     BP 06/22/19 1130 123/78     Pulse Rate 06/22/19 1130 91     Resp 06/22/19 1130 18     Temp 06/22/19 1130 98.2 F (36.8 C)     Temp Source 06/22/19 1130 Oral     SpO2 06/22/19 1130 98 %     Weight 06/22/19 1131 175 lb (79.4 kg)     Height 06/22/19 1131 5\' 7"  (1.702 m)     Head Circumference --      Peak Flow --      Pain Score 06/22/19 1130 0     Pain Loc --      Pain Edu? --      Excl. in GC? --    No data found.  Updated Vital Signs BP 123/78 (BP Location: Right Arm)   Pulse 91   Temp 98.2 F (36.8 C) (Oral)   Resp 18   Ht 5\' 7"  (1.702 m)   Wt 175 lb (79.4 kg)   SpO2 98%   BMI 27.41 kg/m   Visual Acuity Right Eye Distance:   Left Eye Distance:   Bilateral Distance:    Right Eye Near:   Left Eye Near:    Bilateral Near:     Physical Exam Vitals and nursing note reviewed.  Constitutional:      General: She is not in acute distress.    Appearance: Normal appearance. She is well-developed and normal weight. She is not ill-appearing, toxic-appearing or diaphoretic.  HENT:     Head: Normocephalic and atraumatic.     Right Ear: Tympanic membrane normal.     Left Ear: Tympanic membrane normal.     Nose: Nose normal.  Eyes:     Extraocular Movements: Extraocular movements intact.     Conjunctiva/sclera: Conjunctivae normal.  Cardiovascular:     Rate and Rhythm: Normal rate and regular rhythm.     Heart sounds: Normal heart sounds. No murmur.  Pulmonary:     Effort: Pulmonary effort is normal. No respiratory distress.     Breath sounds: Normal breath sounds. No stridor. No wheezing, rhonchi or rales.  Chest:     Chest wall: No tenderness.  Abdominal:     General: Bowel sounds are normal. There is no distension.     Palpations: Abdomen is soft. There is no mass.     Tenderness: There  is no abdominal tenderness. There is no guarding or rebound.     Hernia: No hernia is present.  Musculoskeletal:        General: Normal range of  motion.     Cervical back: Normal range of motion and neck supple.  Skin:    General: Skin is warm and dry.     Capillary Refill: Capillary refill takes less than 2 seconds.     Findings: Rash present.     Comments: Vesicular itchy rash to both forearms, left cheek and around eyelid.    Neurological:     General: No focal deficit present.     Mental Status: She is alert and oriented to person, place, and time.  Psychiatric:        Mood and Affect: Mood normal.        Behavior: Behavior normal.      UC Treatments / Results  Labs (all labs ordered are listed, but only abnormal results are displayed) Labs Reviewed - No data to display  EKG   Radiology No results found.  Procedures Procedures (including critical care time)  Medications Ordered in UC Medications  dexamethasone (DECADRON) injection 10 mg (10 mg Intramuscular Given 06/22/19 1148)    Initial Impression / Assessment and Plan / UC Course  I have reviewed the triage vital signs and the nursing notes.  Pertinent labs & imaging results that were available during my care of the patient were reviewed by me and considered in my medical decision making (see chart for details).     Contact dermatitis, likely from known food plant contact.  Decadron 10 mg IM given in office today to help with itching.  Pred pack sent to patient's pharmacy, start tomorrow, instructed to take this medication as directed on package.  Instructed to follow-up if rash is not healing by the time she is finished with the Pred pack.  Also sent in clobetasol to apply to rash on body.  Instructed that clobetasol is not intended to use on her face.  Instructed to go to that ER if the rash spreads to cover her eyelids, has trouble breathing, trouble swallowing, other concerning symptoms. Final Clinical Impressions(s) / UC Diagnoses   Final diagnoses:  Rash  Contact dermatitis due to plants, except food, unspecified contact dermatitis type      Discharge Instructions     You have received a shot of steroids in our office today.  I have called in a steroid pack for you to start tomorrow.  Take as directed on the package.  I have also sent in clobetasol for you to apply topically to your rash.  Do not put this on your face.  Follow-up with primary care office as needed.  Go to the ER for trouble swallowing, trouble breathing, other concerning symptoms.    ED Prescriptions    Medication Sig Dispense Auth. Provider   predniSONE (STERAPRED UNI-PAK 21 TAB) 10 MG (21) TBPK tablet Take by mouth daily for 6 days. Take 6 tablets on day 1, 5 tablets on day 2, 4 tablets on day 3, 3 tablets on day 4, 2 tablets on day 5, 1 tablet on day 6 21 tablet Moshe Cipro, NP   clobetasol (TEMOVATE) 0.05 % GEL Apply to affected area twice daily as needed for rash itching and burning 30 g Moshe Cipro, NP     I have reviewed the PDMP during this encounter.   Moshe Cipro, NP 06/22/19 1938

## 2019-06-22 NOTE — ED Triage Notes (Signed)
Pt states she and her dad were cutting down a tree 4 days ago and she developed red raised bumps on arms bilat, left flank area and left ear. Pt states it is itchy.

## 2019-06-22 NOTE — Discharge Instructions (Signed)
You have received a shot of steroids in our office today.  I have called in a steroid pack for you to start tomorrow.  Take as directed on the package.  I have also sent in clobetasol for you to apply topically to your rash.  Do not put this on your face.  Follow-up with primary care office as needed.  Go to the ER for trouble swallowing, trouble breathing, other concerning symptoms.

## 2019-12-29 ENCOUNTER — Encounter (HOSPITAL_COMMUNITY): Payer: Self-pay

## 2019-12-29 ENCOUNTER — Other Ambulatory Visit: Payer: Self-pay

## 2019-12-29 ENCOUNTER — Ambulatory Visit (HOSPITAL_COMMUNITY)
Admission: EM | Admit: 2019-12-29 | Discharge: 2019-12-29 | Disposition: A | Discharge status: Home / Self Care | Payer: HRSA Program | Attending: Family Medicine | Admitting: Family Medicine

## 2019-12-29 DIAGNOSIS — Z79899 Other long term (current) drug therapy: Secondary | ICD-10-CM | POA: Diagnosis not present

## 2019-12-29 DIAGNOSIS — F909 Attention-deficit hyperactivity disorder, unspecified type: Secondary | ICD-10-CM | POA: Diagnosis not present

## 2019-12-29 DIAGNOSIS — Z7901 Long term (current) use of anticoagulants: Secondary | ICD-10-CM | POA: Diagnosis not present

## 2019-12-29 DIAGNOSIS — Z20822 Contact with and (suspected) exposure to covid-19: Secondary | ICD-10-CM | POA: Diagnosis not present

## 2019-12-29 DIAGNOSIS — J029 Acute pharyngitis, unspecified: Secondary | ICD-10-CM | POA: Insufficient documentation

## 2019-12-29 DIAGNOSIS — R05 Cough: Secondary | ICD-10-CM | POA: Diagnosis present

## 2019-12-29 DIAGNOSIS — F329 Major depressive disorder, single episode, unspecified: Secondary | ICD-10-CM | POA: Diagnosis not present

## 2019-12-29 DIAGNOSIS — Z87891 Personal history of nicotine dependence: Secondary | ICD-10-CM | POA: Diagnosis not present

## 2019-12-29 DIAGNOSIS — J069 Acute upper respiratory infection, unspecified: Secondary | ICD-10-CM | POA: Diagnosis not present

## 2019-12-29 LAB — SARS CORONAVIRUS 2 (TAT 6-24 HRS): SARS Coronavirus 2: NEGATIVE

## 2019-12-29 MED ORDER — LIDOCAINE VISCOUS HCL 2 % MT SOLN: 5.0000 mL | OROMUCOSAL | 0 refills | Status: DC | PRN

## 2019-12-29 NOTE — ED Provider Notes (Signed)
[facility]    CSN: [id number] Arrival date & time: 12/29/19  0805      History   Chief Complaint Chief Complaint  Patient presents with   Cough   Generalized Body Aches   Headache   Sore Throat    HPI Janet Mccoy is a 29 y.o. female.   Patient presenting today with 2 days of worsening cough, sore throat, headache, generalized body aches, low grade fevers. Denies abdominal pain, N/V/D, CP, SOB, wheezing. Has not been trying anything OTC at this point. No hx of sick contacts, pulmonary dz, immune compromise.      Past Medical History:  Diagnosis Date   ADHD    Anxiety    Depression    Endometriosis    Posterior tibial tendinitis of left lower extremity 10/2016    Patient Active Problem List   Diagnosis Date Noted   Endometriosis 11/11/2013   Pelvic pain in female 11/11/2013    Past Surgical History:  Procedure Laterality Date   LAPAROSCOPY N/A 11/11/2013   Procedure: LAPAROSCOPY DIAGNOSTIC, lysis of adhesions right adenexa;  Surgeon: Meriel Pica, MD;  Location: [facility];  Service: Gynecology;  Laterality: N/A;   TENOLYSIS Left 10/31/2016   Procedure: Left posterior tibial tendon tenolysis;  Surgeon: Toni Arthurs, MD;  Location: [facility];  Service: Orthopedics;  Laterality: Left;    OB History    Gravida  0   Para      Term      Preterm      AB      Living        SAB      TAB      Ectopic      Multiple      Live Births               Home Medications    Prior to Admission medications   Medication Sig Start Date End Date Taking? Authorizing Provider  Amphetamine-Dextroamphetamine (AMPHETAMINE SALT COMBO PO) Take 40 mg by mouth.    [provider]  citalopram (CELEXA) 10 MG tablet Take 10 mg by mouth daily.    [provider]  clobetasol (TEMOVATE) 0.05 % GEL Apply to affected area twice daily as needed for rash itching and burning 06/22/19   Moshe Cipro, NP  docusate sodium (COLACE) 100 MG capsule Take 1 capsule (100 mg total) by mouth 2 (two) times daily. While taking narcotic pain medicine. 10/31/16   Jacinta Shoe, PA-C  doxycycline (ADOXA) 100 MG tablet Take 100 mg by mouth 2 (two) times daily. 10/21/16   [provider]  enoxaparin (LOVENOX) 40 MG/0.4ML injection Inject 0.4 mLs (40 mg total) into the skin daily. 10/31/16   Jacinta Shoe, PA-C  HYDROcodone-acetaminophen (NORCO/VICODIN) 5-325 MG tablet Take 1-2 tablets by mouth every 4 (four) hours as needed for moderate pain. For no more than 3 days. 10/31/16   Jacinta Shoe, PA-C  ketorolac (TORADOL) 10 MG tablet Take 1 tablet (10 mg total) by mouth every 6 (six) hours as needed. 10/31/16   Jacinta Shoe, PA-C  lidocaine (XYLOCAINE) 2 % solution Use as directed 5-10 mLs in the mouth or throat as needed for mouth pain. 12/29/19   Particia Nearing, PA-C  senna (SENOKOT) 8.6 MG TABS tablet Take 2 tablets (17.2 mg total) by mouth 2 (two) times daily. 10/31/16   Jacinta Shoe, PA-C    Family History History reviewed. No pertinent family  history.  Social History Social History   Tobacco Use   Smoking status: Former Smoker    Packs/day: 0.00    Years: 6.00    Pack years: 0.00    Quit date: 11/06/2015    Years since quitting: 4.1   Smokeless tobacco: Never Used  Vaping Use   Vaping Use: Never used  Substance Use Topics   Alcohol use: Yes    Comment: occasionally   Drug use: No    Comment: AVERAGE USE 7GRAMS EVERY 2 DAYS     Allergies   Patient has no known allergies.   Review of Systems Review of Systems PER HPI    Physical Exam Triage Vital Signs ED Triage Vitals  Enc Vitals Group     BP 12/29/19 0829 115/84     Pulse Rate 12/29/19 0829 78     Resp 12/29/19 0829 18     Temp 12/29/19 0829 98.3 F (36.8 C)     Temp Source 12/29/19 0829 Oral     SpO2 12/29/19 0829 100 %     Weight --      Height --      Head  Circumference --      Peak Flow --      Pain Score 12/29/19 0827 8     Pain Loc --      Pain Edu? --      Excl. in GC? --    No data found.  Updated Vital Signs BP 115/84 (BP Location: Right Arm)    Pulse 78    Temp 98.3 F (36.8 C) (Oral)    Resp 18    LMP  (Within Weeks) Comment: 1 week   SpO2 100%   Visual Acuity Right Eye Distance:   Left Eye Distance:   Bilateral Distance:    Right Eye Near:   Left Eye Near:    Bilateral Near:     Physical Exam Vitals and nursing note reviewed.  Constitutional:      Appearance: Normal appearance. She is not ill-appearing.  HENT:     Head: Atraumatic.  Eyes:     Extraocular Movements: Extraocular movements intact.     Conjunctiva/sclera: Conjunctivae normal.  Cardiovascular:     Rate and Rhythm: Normal rate and regular rhythm.     Heart sounds: Normal heart sounds.  Pulmonary:     Effort: Pulmonary effort is normal.     Breath sounds: Normal breath sounds.  Abdominal:     General: Bowel sounds are normal. There is no distension.     Palpations: Abdomen is soft.     Tenderness: There is no abdominal tenderness.  Musculoskeletal:        General: Normal range of motion.     Cervical back: Normal range of motion and neck supple.  Skin:    General: Skin is warm and dry.  Neurological:     Mental Status: She is alert and oriented to person, place, and time.  Psychiatric:        Mood and Affect: Mood normal.        Thought Content: Thought content normal.        Judgment: Judgment normal.    UC Treatments / Results  Labs (all labs ordered are listed, but only abnormal results are displayed) Labs Reviewed  SARS CORONAVIRUS 2 (TAT 6-24 HRS)    EKG   Radiology No results found.  Procedures Procedures (including critical care time)  Medications Ordered in UC Medications - No data to  display  Initial Impression / Assessment and Plan / UC Course  I have reviewed the triage vital signs and the nursing  notes.  Pertinent labs & imaging results that were available during my care of the patient were reviewed by me and considered in my medical decision making (see chart for details).     Suspicious for COVID 19, test results pending. WIll provide work note and discussed isolation protocol. Viscous lidocaine for sore throat prn, mucinex DM and other OTC remedies reviewed. Follow up if sxs worsening or not improving.    Final Clinical Impressions(s) / UC Diagnoses   Final diagnoses:  Viral URI with cough   Discharge Instructions   None    ED Prescriptions    Medication Sig Dispense Auth. Provider   lidocaine (XYLOCAINE) 2 % solution Use as directed 5-10 mLs in the mouth or throat as needed for mouth pain. 100 mL Particia Nearing, [place]     PDMP not reviewed this encounter.   Roosvelt Maser [place], [place] 12/29/19 [phone]

## 2019-12-29 NOTE — ED Triage Notes (Signed)
Pt presents with cough, headache, body aches and headache x 2 days. Denies fever, sob.

## 2020-03-11 ENCOUNTER — Ambulatory Visit
Admission: EM | Admit: 2020-03-11 | Discharge: 2020-03-11 | Disposition: A | Discharge status: Home / Self Care | Payer: Self-pay | Attending: Urgent Care | Admitting: Urgent Care

## 2020-03-11 ENCOUNTER — Encounter: Payer: Self-pay | Admitting: Urgent Care

## 2020-03-11 ENCOUNTER — Other Ambulatory Visit: Payer: Self-pay

## 2020-03-11 DIAGNOSIS — J069 Acute upper respiratory infection, unspecified: Secondary | ICD-10-CM | POA: Insufficient documentation

## 2020-03-11 DIAGNOSIS — R0981 Nasal congestion: Secondary | ICD-10-CM | POA: Insufficient documentation

## 2020-03-11 DIAGNOSIS — Z1152 Encounter for screening for COVID-19: Secondary | ICD-10-CM | POA: Insufficient documentation

## 2020-03-11 LAB — POCT RAPID STREP A (OFFICE): Rapid Strep A Screen: NEGATIVE

## 2020-03-11 MED ORDER — CETIRIZINE HCL 10 MG PO TABS: 10.0000 mg | ORAL_TABLET | Freq: Every day | ORAL | 0 refills | Status: DC

## 2020-03-11 MED ORDER — PREDNISONE 20 MG PO TABS: ORAL_TABLET | ORAL | 0 refills | Status: DC

## 2020-03-11 NOTE — ED Triage Notes (Signed)
Patient states that she has had sore throat, body aches, headaches, fatigue, nasal congestion x 1 week. Pt states it improved slightly and then gets worse. Pt is aox4 and ambulatory.

## 2020-03-11 NOTE — Discharge Instructions (Signed)

## 2020-03-11 NOTE — ED Provider Notes (Signed)
[facility]   MRN: [id number] DOB: 12/04/1990  Subjective:   Janet Mccoy is a 29 y.o. female presenting for 5 day history of acute onset throat pain, body aches, sinus headaches, malaise and fatigue, sinus congestion.  Patient has been using over-the-counter medications with some relief.  Denies loss of sense of taste and smell, chest pain, shortness of breath.  Patient is not a smoker but she does use vaping.  No current facility-administered medications for this encounter.  Current Outpatient Medications:  .  Amphetamine-Dextroamphetamine (AMPHETAMINE SALT COMBO PO), Take 40 mg by mouth., Disp: , Rfl:  .  citalopram (CELEXA) 10 MG tablet, Take 10 mg by mouth daily., Disp: , Rfl:  .  clobetasol (TEMOVATE) 0.05 % GEL, Apply to affected area twice daily as needed for rash itching and burning, Disp: 30 g, Rfl: 0   No Known Allergies  Past Medical History:  Diagnosis Date  . ADHD   . Anxiety   . Depression   . Endometriosis   . Posterior tibial tendinitis of left lower extremity 10/2016     Past Surgical History:  Procedure Laterality Date  . LAPAROSCOPY N/A 11/11/2013   Procedure: LAPAROSCOPY DIAGNOSTIC, lysis of adhesions right adenexa;  Surgeon: Meriel Pica, MD;  Location: [facility];  Service: Gynecology;  Laterality: N/A;  . TENOLYSIS Left 10/31/2016   Procedure: Left posterior tibial tendon tenolysis;  Surgeon: Toni Arthurs, MD;  Location: [facility];  Service: Orthopedics;  Laterality: Left;    History reviewed. No pertinent family history.  Social History   Tobacco Use  . Smoking status: Former Smoker    Packs/day: 0.00    Years: 6.00    Pack years: 0.00    Quit date: 11/06/2015    Years since quitting: 4.3  . Smokeless tobacco: Never Used  Vaping Use  . Vaping Use: Never used  Substance Use Topics  . Alcohol use: Yes    Comment: occasionally  . Drug use: No    Comment: AVERAGE USE 7GRAMS EVERY 2 DAYS     ROS   Objective:   Vitals: BP 121/78 (BP Location: Left Arm)   Pulse 74   Resp 18   LMP 02/27/2020 (Approximate)   SpO2 98%   Physical Exam Constitutional:      General: She is not in acute distress.    Appearance: Normal appearance. She is well-developed. She is ill-appearing. She is not toxic-appearing or diaphoretic.  HENT:     Head: Normocephalic and atraumatic.     Right Ear: Tympanic membrane and ear canal normal. No drainage or tenderness. No middle ear effusion. Tympanic membrane is not erythematous.     Left Ear: Tympanic membrane and ear canal normal. No drainage or tenderness.  No middle ear effusion. Tympanic membrane is not erythematous.     Nose: Congestion and rhinorrhea present.     Mouth/Throat:     Mouth: Mucous membranes are moist. No oral lesions.     Pharynx: No pharyngeal swelling, oropharyngeal exudate, posterior oropharyngeal erythema or uvula swelling.     Tonsils: No tonsillar exudate or tonsillar abscesses.     Comments: Significant postnasal drainage overlying pharynx. Eyes:     General: No scleral icterus.       Right eye: No discharge.        Left eye: No discharge.     Extraocular Movements: Extraocular movements intact.     Right eye: Normal extraocular motion.     Left eye: Normal  extraocular motion.     Conjunctiva/sclera: Conjunctivae normal.     Pupils: Pupils are equal, round, and reactive to light.  Cardiovascular:     Rate and Rhythm: Normal rate and regular rhythm.     Pulses: Normal pulses.     Heart sounds: Normal heart sounds. No murmur heard.  No friction rub. No gallop.   Pulmonary:     Effort: Pulmonary effort is normal. No respiratory distress.     Breath sounds: Normal breath sounds. No stridor. No wheezing, rhonchi or rales.  Musculoskeletal:     Cervical back: Normal range of motion and neck supple.  Lymphadenopathy:     Cervical: No cervical adenopathy.  Skin:    General: Skin is warm and dry.     Findings: No  rash.  Neurological:     General: No focal deficit present.     Mental Status: She is alert and oriented to person, place, and time.  Psychiatric:        Mood and Affect: Mood normal.        Behavior: Behavior normal.        Thought Content: Thought content normal.        Judgment: Judgment normal.     Results for orders placed or performed during the hospital encounter of 03/11/20 (from the past 24 hour(s))  POCT rapid strep A     Status: None   Collection Time: 03/11/20 12:52 PM  Result Value Ref Range   Rapid Strep A Screen Negative Negative    Assessment and Plan :   PDMP not reviewed this encounter.  1. Viral URI with cough   2. Encounter for screening for COVID-19   3. Sinus congestion     Review light of antibiotic stewardship, will hold off on antibiotic use for now. Use oral prednisone course to address significant sinus inflammation.  Recommended supportive care otherwise, COVID-19 testing pending. Counseled patient on potential for adverse effects with medications prescribed/recommended today, ER and return-to-clinic precautions discussed, patient verbalized understanding.    Wallis Bamberg, PA-C 03/11/20 1303

## 2020-03-13 LAB — SARS-COV-2, NAA 2 DAY TAT

## 2020-03-13 LAB — NOVEL CORONAVIRUS, NAA: SARS-CoV-2, NAA: NOT DETECTED

## 2020-03-15 LAB — CULTURE, GROUP A STREP (THRC)

## 2021-06-22 ENCOUNTER — Other Ambulatory Visit: Payer: Self-pay | Admitting: Orthopaedic Surgery

## 2021-06-22 DIAGNOSIS — M5416 Radiculopathy, lumbar region: Secondary | ICD-10-CM

## 2021-06-25 ENCOUNTER — Ambulatory Visit
Admission: RE | Admit: 2021-06-25 | Discharge: 2021-06-25 | Disposition: A | Discharge status: Home / Self Care | Payer: Self-pay | Source: Ambulatory Visit | Attending: Orthopaedic Surgery | Admitting: Orthopaedic Surgery

## 2021-06-25 ENCOUNTER — Other Ambulatory Visit: Payer: Self-pay

## 2021-06-25 DIAGNOSIS — M5416 Radiculopathy, lumbar region: Secondary | ICD-10-CM

## 2021-11-13 ENCOUNTER — Emergency Department (HOSPITAL_COMMUNITY)
Admission: EM | Admit: 2021-11-13 | Discharge: 2021-11-14 | Discharge status: Against Medical Advice | Payer: BC Managed Care – PPO | Attending: Emergency Medicine | Admitting: Emergency Medicine

## 2021-11-13 ENCOUNTER — Encounter (HOSPITAL_COMMUNITY): Payer: Self-pay

## 2021-11-13 DIAGNOSIS — Z5321 Procedure and treatment not carried out due to patient leaving prior to being seen by health care provider: Secondary | ICD-10-CM | POA: Diagnosis not present

## 2021-11-13 DIAGNOSIS — R519 Headache, unspecified: Secondary | ICD-10-CM | POA: Diagnosis not present

## 2021-11-13 DIAGNOSIS — R509 Fever, unspecified: Secondary | ICD-10-CM | POA: Diagnosis not present

## 2021-11-13 DIAGNOSIS — M791 Myalgia, unspecified site: Secondary | ICD-10-CM | POA: Insufficient documentation

## 2021-11-13 DIAGNOSIS — J029 Acute pharyngitis, unspecified: Secondary | ICD-10-CM | POA: Insufficient documentation

## 2021-11-13 DIAGNOSIS — R110 Nausea: Secondary | ICD-10-CM | POA: Insufficient documentation

## 2021-11-13 NOTE — ED Triage Notes (Signed)
Took tylenol ~ 3 hours ago.

## 2021-11-13 NOTE — ED Triage Notes (Signed)
Patient c/o body pain, fever and nausea that started ~ 2 days ago. Also sore throat and headache.

## 2021-11-14 NOTE — ED Notes (Signed)
Pt called x3 in ED lobby. Currently not present and did not respond to name call at noted times, triage RN notified. Pt could not be located within ED lobby.  °

## 2022-08-05 DIAGNOSIS — Z01419 Encounter for gynecological examination (general) (routine) without abnormal findings: Secondary | ICD-10-CM | POA: Diagnosis not present

## 2022-08-05 DIAGNOSIS — Z1151 Encounter for screening for human papillomavirus (HPV): Secondary | ICD-10-CM | POA: Diagnosis not present

## 2022-08-05 DIAGNOSIS — N760 Acute vaginitis: Secondary | ICD-10-CM | POA: Diagnosis not present

## 2022-08-05 DIAGNOSIS — Z124 Encounter for screening for malignant neoplasm of cervix: Secondary | ICD-10-CM | POA: Diagnosis not present

## 2022-08-05 DIAGNOSIS — Z113 Encounter for screening for infections with a predominantly sexual mode of transmission: Secondary | ICD-10-CM | POA: Diagnosis not present

## 2022-08-05 DIAGNOSIS — R8279 Other abnormal findings on microbiological examination of urine: Secondary | ICD-10-CM | POA: Diagnosis not present

## 2022-08-05 DIAGNOSIS — Z6829 Body mass index (BMI) 29.0-29.9, adult: Secondary | ICD-10-CM | POA: Diagnosis not present

## 2022-08-15 DIAGNOSIS — R102 Pelvic and perineal pain: Secondary | ICD-10-CM | POA: Diagnosis not present

## 2022-09-17 DIAGNOSIS — R21 Rash and other nonspecific skin eruption: Secondary | ICD-10-CM | POA: Diagnosis not present

## 2022-09-17 DIAGNOSIS — L237 Allergic contact dermatitis due to plants, except food: Secondary | ICD-10-CM | POA: Diagnosis not present

## 2022-09-17 DIAGNOSIS — L299 Pruritus, unspecified: Secondary | ICD-10-CM | POA: Diagnosis not present

## 2022-11-11 ENCOUNTER — Encounter (HOSPITAL_COMMUNITY): Payer: Self-pay

## 2022-11-11 ENCOUNTER — Ambulatory Visit (HOSPITAL_COMMUNITY)
Admission: EM | Admit: 2022-11-11 | Discharge: 2022-11-11 | Disposition: A | Discharge status: Home / Self Care | Payer: BC Managed Care – PPO | Attending: Urgent Care | Admitting: Urgent Care

## 2022-11-11 DIAGNOSIS — R21 Rash and other nonspecific skin eruption: Secondary | ICD-10-CM

## 2022-11-11 MED ORDER — PREDNISONE 10 MG PO TABS: ORAL_TABLET | ORAL | 0 refills | Status: AC

## 2022-11-11 MED ORDER — METHYLPREDNISOLONE ACETATE 40 MG/ML IJ SUSP
INTRAMUSCULAR | Status: AC
  Filled 2022-11-11: qty 1

## 2022-11-11 MED ORDER — METHYLPREDNISOLONE ACETATE 40 MG/ML IJ SUSP
40.0000 mg | Freq: Once | INTRAMUSCULAR | Status: AC
  Administered 2022-11-11: 40 mg via INTRAMUSCULAR

## 2022-11-11 NOTE — Discharge Instructions (Signed)
Start prescribed prednisone tomorrow morning with breakfast.  Use antiacid medication if necessary for acid indigestion.  Follow up here or with your primary care provider if your symptoms are worsening or not improving with treatment.

## 2022-11-11 NOTE — ED Triage Notes (Signed)
Patient here today with c/o irritation to inner thighs and down both legs. There is a brown spot on the back of her left upper left that appears like it could have been a bit from an insect. Patient went tubing down [facility] yesterday and climbed up a tree to jump into the water. She was straddling a tree limb.

## 2022-11-11 NOTE — ED Provider Notes (Signed)
[facility]    CSN: [id number] Arrival date & time: 11/11/22  1457      History   Chief Complaint No chief complaint on file.   HPI Kazue Blanding is a 32 y.o. female.   HPI  Presents to urgent care with complaint of possible allergic reaction [place] and [place] a tree to jump in the water.   She complains of irritation to her inner thighs and legs.  Also reports a "brown spot" on the back of her left upper thigh, possibly from insect bite.    Past Medical History:  Diagnosis Date   ADHD    Anxiety    Depression    Endometriosis    Posterior tibial tendinitis of left lower extremity 10/2016    Patient Active Problem List   Diagnosis Date Noted   Endometriosis 11/11/2013   Pelvic pain in female 11/11/2013    Past Surgical History:  Procedure Laterality Date   LAPAROSCOPY N/A 11/11/2013   Procedure: LAPAROSCOPY DIAGNOSTIC, lysis of adhesions right adenexa;  Surgeon: Meriel Pica, MD;  Location: [facility];  Service: Gynecology;  Laterality: N/A;   TENOLYSIS Left 10/31/2016   Procedure: Left posterior tibial tendon tenolysis;  Surgeon: Toni Arthurs, MD;  Location: [facility];  Service: Orthopedics;  Laterality: Left;    OB History     Gravida  0   Para      Term      Preterm      AB      Living         SAB      IAB      Ectopic      Multiple      Live Births               Home Medications    Prior to Admission medications   Medication Sig Start Date End Date Taking? Authorizing Provider  Amphetamine-Dextroamphetamine (AMPHETAMINE SALT COMBO PO) Take 40 mg by mouth.    [provider]  cetirizine (ZYRTEC ALLERGY) 10 MG tablet Take 1 tablet (10 mg total) by mouth daily. 03/11/20   Wallis Bamberg, PA-C  citalopram (CELEXA) 10 MG tablet Take 10 mg by mouth daily.    [provider]  clobetasol (TEMOVATE) 0.05 % GEL Apply to affected area  twice daily as needed for rash itching and burning 06/22/19   Moshe Cipro, NP  predniSONE (DELTASONE) 20 MG tablet Take 2 tablets daily with breakfast. 03/11/20   Wallis Bamberg, PA-C  enoxaparin (LOVENOX) 40 MG/0.4ML injection Inject 0.4 mLs (40 mg total) into the skin daily. 10/31/16 03/11/20  Jacinta Shoe, PA-C    Family History No family history on file.  Social History Social History   Tobacco Use   Smoking status: Former    Current packs/day: 0.00    Types: Cigarettes    Quit date: 11/05/2009    Years since quitting: 13.0   Smokeless tobacco: Never  Vaping Use   Vaping status: Never Used  Substance Use Topics   Alcohol use: Yes    Comment: occasionally   Drug use: No    Comment: AVERAGE USE 7GRAMS EVERY 2 DAYS     Allergies   Patient has no known allergies.   Review of Systems Review of Systems   Physical Exam Triage Vital Signs ED Triage Vitals  Encounter Vitals Group     BP      Systolic BP  Percentile      Diastolic BP Percentile      Pulse      Resp      Temp      Temp src      SpO2      Weight      Height      Head Circumference      Peak Flow      Pain Score      Pain Loc      Pain Education      Exclude from Growth Chart    No data found.  Updated Vital Signs There were no vitals taken for this visit.  Visual Acuity Right Eye Distance:   Left Eye Distance:   Bilateral Distance:    Right Eye Near:   Left Eye Near:    Bilateral Near:     Physical Exam Vitals reviewed.  Constitutional:      Appearance: Normal appearance.  Skin:    General: Skin is warm and dry.     Findings: Bruising and rash present.       Neurological:     General: No focal deficit present.     Mental Status: She is alert and oriented to person, place, and time.  Psychiatric:        Mood and Affect: Mood normal.        Behavior: Behavior normal.      UC Treatments / Results  Labs (all labs ordered are listed, but only abnormal results are  displayed) Labs Reviewed - No data to display  EKG   Radiology No results found.  Procedures Procedures (including critical care time)  Medications Ordered in UC Medications - No data to display  Initial Impression / Assessment and Plan / UC Course  I have reviewed the triage vital signs and the nursing notes.  Pertinent labs & imaging results that were available during my care of the patient were reviewed by me and considered in my medical decision making (see chart for details).   Ohanna Yokota is a 32 y.o. female presenting with erythematous rash. Patient is afebrile without recent antipyretics, satting well on room air. Overall is well appearing well hydrated, without respiratory distress.  Erythematous rash is present on the patient's inner thighs, excluding the groin area where in was protected by her bathing suit.  Ecchymoses are present on the patient's right lateral thigh  Reviewed relevant chart history.   Likely contact dermatitis with unknown environmental allergen while climbing trees along the [facility].  Treated in clinic with methylprednisolone 40 mg IM and will discharge patient with a prednisone taper.  Counseled patient on potential for adverse effects with medications prescribed/recommended today, ER and return-to-clinic precautions discussed, patient verbalized understanding and agreement with care plan.  Final Clinical Impressions(s) / UC Diagnoses   Final diagnoses:  None   Discharge Instructions   None    ED Prescriptions   None    PDMP not reviewed this encounter.   Charma Igo, [place] 11/11/22 1553

## 2023-01-03 ENCOUNTER — Ambulatory Visit (HOSPITAL_COMMUNITY)
Admission: EM | Admit: 2023-01-03 | Discharge: 2023-01-03 | Disposition: A | Discharge status: Home / Self Care | Payer: BC Managed Care – PPO | Attending: Internal Medicine | Admitting: Internal Medicine

## 2023-01-03 ENCOUNTER — Encounter (HOSPITAL_COMMUNITY): Payer: Self-pay

## 2023-01-03 DIAGNOSIS — Z1152 Encounter for screening for COVID-19: Secondary | ICD-10-CM | POA: Insufficient documentation

## 2023-01-03 DIAGNOSIS — J988 Other specified respiratory disorders: Secondary | ICD-10-CM | POA: Diagnosis not present

## 2023-01-03 DIAGNOSIS — B9789 Other viral agents as the cause of diseases classified elsewhere: Secondary | ICD-10-CM | POA: Insufficient documentation

## 2023-01-03 LAB — POCT RAPID STREP A (OFFICE): Rapid Strep A Screen: NEGATIVE

## 2023-01-03 LAB — SARS CORONAVIRUS 2 (TAT 6-24 HRS): SARS Coronavirus 2: NEGATIVE

## 2023-01-03 NOTE — ED Triage Notes (Signed)
Patient c/o fever, generalized body aches, a non productive cough sore throat, and nasal congestion x 2 days.  Patient states she has ben taking TheraFlu.

## 2023-01-03 NOTE — Discharge Instructions (Signed)
Your COVID results will come back within 24 hours, and someone will call you if results are positive.  You can continue taking TheraFlu for your symptoms if it helps.  Otherwise I recommend Mucinex for congestion and cough.  You can alternate between Tylenol and ibuprofen every 4-6 hours as needed for pain and fever.  Get plenty of rest and stay hydrated.  Return here as needed.

## 2023-01-03 NOTE — ED Provider Notes (Signed)
[facility]    CSN: [id number] Arrival date & time: 01/03/23  6213      History   Chief Complaint Chief Complaint  Patient presents with   Fever   Sore Throat   Cough   Nasal Congestion    HPI Janet Mccoy is a 32 y.o. female.   Patient presents with bodyaches, cough, sore throat, congestion, and fever x 2 days.  Patient reports she has been taking TheraFlu with some relief.  Denies shortness of breath, headaches, dizziness, abdominal pain, nausea, vomiting, and diarrhea.   Fever Associated symptoms: chills, congestion, cough, rhinorrhea and sore throat   Associated symptoms: no chest pain, no diarrhea, no headaches, no nausea and no vomiting   Sore Throat Pertinent negatives include no chest pain, no abdominal pain, no headaches and no shortness of breath.  Cough Associated symptoms: chills, fever, rhinorrhea and sore throat   Associated symptoms: no chest pain, no headaches and no shortness of breath     Past Medical History:  Diagnosis Date   ADHD    Anxiety    Depression    Endometriosis    Posterior tibial tendinitis of left lower extremity 10/2016    Patient Active Problem List   Diagnosis Date Noted   Endometriosis 11/11/2013   Pelvic pain in female 11/11/2013    Past Surgical History:  Procedure Laterality Date   LAPAROSCOPY N/A 11/11/2013   Procedure: LAPAROSCOPY DIAGNOSTIC, lysis of adhesions right adenexa;  Surgeon: Meriel Pica, MD;  Location: [facility];  Service: Gynecology;  Laterality: N/A;   TENOLYSIS Left 10/31/2016   Procedure: Left posterior tibial tendon tenolysis;  Surgeon: Toni Arthurs, MD;  Location: [facility];  Service: Orthopedics;  Laterality: Left;    OB History     Gravida  0   Para      Term      Preterm      AB      Living         SAB      IAB      Ectopic      Multiple      Live Births               Home Medications    Prior to Admission  medications   Medication Sig Start Date End Date Taking? Authorizing Provider  enoxaparin (LOVENOX) 40 MG/0.4ML injection Inject 0.4 mLs (40 mg total) into the skin daily. 10/31/16 03/11/20  Jacinta Shoe, PA-C    Family History Family History  Problem Relation Age of Onset   COPD Mother     Social History Social History   Tobacco Use   Smoking status: Former    Current packs/day: 0.00    Types: Cigarettes    Quit date: 11/05/2009    Years since quitting: 13.1   Smokeless tobacco: Never  Vaping Use   Vaping status: Never Used  Substance Use Topics   Alcohol use: Yes    Comment: occasionally   Drug use: No    Comment: AVERAGE USE 7GRAMS EVERY 2 DAYS     Allergies   Patient has no known allergies.   Review of Systems Review of Systems  Constitutional:  Positive for chills, fatigue and fever.  HENT:  Positive for congestion, rhinorrhea and sore throat. Negative for trouble swallowing.   Respiratory:  Positive for cough. Negative for chest tightness and shortness of breath.   Cardiovascular:  Negative for chest pain.  Gastrointestinal:  Negative for abdominal pain, diarrhea, nausea and vomiting.  Neurological:  Negative for dizziness, weakness, light-headedness and headaches.     Physical Exam Triage Vital Signs ED Triage Vitals  Encounter Vitals Group     BP 01/03/23 0935 125/86     Systolic BP Percentile --      Diastolic BP Percentile --      Pulse Rate 01/03/23 0935 66     Resp 01/03/23 0935 16     Temp 01/03/23 0935 99.2 F (37.3 C)     Temp Source 01/03/23 0935 Oral     SpO2 01/03/23 0935 96 %     Weight --      Height --      Head Circumference --      Peak Flow --      Pain Score 01/03/23 0936 5     Pain Loc --      Pain Education --      Exclude from Growth Chart --    No data found.  Updated Vital Signs BP 125/86 (BP Location: Left Arm)   Pulse 66   Temp 99.2 F (37.3 C) (Oral)   Resp 16   LMP 01/03/2023   SpO2 96%   Visual  Acuity Right Eye Distance:   Left Eye Distance:   Bilateral Distance:    Right Eye Near:   Left Eye Near:    Bilateral Near:     Physical Exam Vitals and nursing note reviewed.  Constitutional:      General: She is awake. She is not in acute distress.    Appearance: Normal appearance. She is well-developed and well-groomed. She is not ill-appearing, toxic-appearing or diaphoretic.  HENT:     Right Ear: Tympanic membrane and ear canal normal.     Left Ear: Tympanic membrane and ear canal normal.     Nose: Congestion and rhinorrhea present.     Mouth/Throat:     Mouth: Mucous membranes are moist.     Pharynx: Posterior oropharyngeal erythema and postnasal drip present. No pharyngeal swelling or oropharyngeal exudate.     Tonsils: No tonsillar exudate.  Cardiovascular:     Rate and Rhythm: Normal rate.     Heart sounds: Normal heart sounds.  Pulmonary:     Effort: Pulmonary effort is normal.     Breath sounds: Normal breath sounds.  Musculoskeletal:     Cervical back: Normal range of motion.  Skin:    General: Skin is warm and dry.  Neurological:     Mental Status: She is alert.  Psychiatric:        Behavior: Behavior is cooperative.      UC Treatments / Results  Labs (all labs ordered are listed, but only abnormal results are displayed) Labs Reviewed  SARS CORONAVIRUS 2 (TAT 6-24 HRS)  POCT RAPID STREP A (OFFICE)    EKG   Radiology No results found.  Procedures Procedures (including critical care time)  Medications Ordered in UC Medications - No data to display  Initial Impression / Assessment and Plan / UC Course  I have reviewed the triage vital signs and the nursing notes.  Pertinent labs & imaging results that were available during my care of the patient were reviewed by me and considered in my medical decision making (see chart for details).     Patient presented with 2-day history of bodyaches, cough, sore throat, congestion, and fever.  Reports  taking TheraFlu with some relief.  Denies shortness of breath, headaches, dizziness,  abdominal pain, nausea, vomiting, and diarrhea.  Upon assessment patient has mild erythema to oropharynx, congestion and rhinorrhea present.  Rapid strep was negative.  Did not send culture based on assessment findings.  COVID testing ordered.  Discussed follow-up and return precautions. Final Clinical Impressions(s) / UC Diagnoses   Final diagnoses:  Viral respiratory illness     Discharge Instructions      Your COVID results will come back within 24 hours, and someone will call you if results are positive.  You can continue taking TheraFlu for your symptoms if it helps.  Otherwise I recommend Mucinex for congestion and cough.  You can alternate between Tylenol and ibuprofen every 4-6 hours as needed for pain and fever.  Get plenty of rest and stay hydrated.  Return here as needed.    ED Prescriptions   None    PDMP not reviewed this encounter.   Wynonia Lawman A, NP 01/03/23 1001

## 2023-05-12 DIAGNOSIS — F900 Attention-deficit hyperactivity disorder, predominantly inattentive type: Secondary | ICD-10-CM | POA: Diagnosis not present

## 2023-05-12 DIAGNOSIS — F411 Generalized anxiety disorder: Secondary | ICD-10-CM | POA: Diagnosis not present

## 2023-06-10 DIAGNOSIS — F411 Generalized anxiety disorder: Secondary | ICD-10-CM | POA: Diagnosis not present

## 2023-06-10 DIAGNOSIS — F900 Attention-deficit hyperactivity disorder, predominantly inattentive type: Secondary | ICD-10-CM | POA: Diagnosis not present

## 2023-08-06 DIAGNOSIS — F900 Attention-deficit hyperactivity disorder, predominantly inattentive type: Secondary | ICD-10-CM | POA: Diagnosis not present

## 2023-08-06 DIAGNOSIS — F411 Generalized anxiety disorder: Secondary | ICD-10-CM | POA: Diagnosis not present

## 2023-10-17 ENCOUNTER — Ambulatory Visit: Admitting: Nurse Practitioner

## 2023-11-06 DIAGNOSIS — F411 Generalized anxiety disorder: Secondary | ICD-10-CM | POA: Diagnosis not present

## 2023-11-06 DIAGNOSIS — F900 Attention-deficit hyperactivity disorder, predominantly inattentive type: Secondary | ICD-10-CM | POA: Diagnosis not present

## 2023-11-12 ENCOUNTER — Ambulatory Visit: Admitting: Nurse Practitioner

## 2023-12-17 ENCOUNTER — Encounter: Payer: Self-pay | Admitting: Nurse Practitioner

## 2023-12-17 ENCOUNTER — Ambulatory Visit (INDEPENDENT_AMBULATORY_CARE_PROVIDER_SITE_OTHER): Admitting: Nurse Practitioner

## 2023-12-17 VITALS — BP 128/80 | HR 65 | Temp 98.2°F | Ht 66.25 in | Wt 207.2 lb

## 2023-12-17 DIAGNOSIS — Z114 Encounter for screening for human immunodeficiency virus [HIV]: Secondary | ICD-10-CM

## 2023-12-17 DIAGNOSIS — Z13220 Encounter for screening for lipoid disorders: Secondary | ICD-10-CM

## 2023-12-17 DIAGNOSIS — Z131 Encounter for screening for diabetes mellitus: Secondary | ICD-10-CM | POA: Diagnosis not present

## 2023-12-17 DIAGNOSIS — R202 Paresthesia of skin: Secondary | ICD-10-CM

## 2023-12-17 DIAGNOSIS — D367 Benign neoplasm of other specified sites: Secondary | ICD-10-CM

## 2023-12-17 DIAGNOSIS — I7300 Raynaud's syndrome without gangrene: Secondary | ICD-10-CM

## 2023-12-17 DIAGNOSIS — Z1159 Encounter for screening for other viral diseases: Secondary | ICD-10-CM

## 2023-12-17 LAB — LIPID PANEL
Cholesterol: 237 mg/dL — ABNORMAL HIGH (ref 0–200)
HDL: 73.1 mg/dL (ref >39.00)
LDL Cholesterol: 149 mg/dL — ABNORMAL HIGH (ref 0–99)
NonHDL: 163.78
Total CHOL/HDL Ratio: 3
Triglycerides: 72 mg/dL (ref 0.0–149.0)
VLDL: 14.4 mg/dL (ref 0.0–40.0)

## 2023-12-17 LAB — COMPREHENSIVE METABOLIC PANEL WITH GFR
ALT: 14 U/L (ref 0–35)
AST: 14 U/L (ref 0–37)
Albumin: 4.4 g/dL (ref 3.5–5.2)
Alkaline Phosphatase: 53 U/L (ref 39–117)
BUN: 17 mg/dL (ref 6–23)
CO2: 28 meq/L (ref 19–32)
Calcium: 9.2 mg/dL (ref 8.4–10.5)
Chloride: 103 meq/L (ref 96–112)
Creatinine, Ser: 0.73 mg/dL (ref 0.40–1.20)
GFR: 109.7 mL/min (ref >60.00)
Glucose, Bld: 92 mg/dL (ref 70–99)
Potassium: 4.5 meq/L (ref 3.5–5.1)
Sodium: 138 meq/L (ref 135–145)
Total Bilirubin: 0.3 mg/dL (ref 0.2–1.2)
Total Protein: 7 g/dL (ref 6.0–8.3)

## 2023-12-17 LAB — CBC
HCT: 36.9 % (ref 36.0–46.0)
Hemoglobin: 12.3 g/dL (ref 12.0–15.0)
MCHC: 33.3 g/dL (ref 30.0–36.0)
MCV: 86.9 fl (ref 78.0–100.0)
Platelets: 305 K/uL (ref 150.0–400.0)
RBC: 4.25 Mil/uL (ref 3.87–5.11)
RDW: 14.6 % (ref 11.5–15.5)
WBC: 6.7 K/uL (ref 4.0–10.5)

## 2023-12-17 LAB — HEMOGLOBIN A1C: Hgb A1c MFr Bld: 5.8 % (ref 4.6–6.5)

## 2023-12-17 LAB — TSH: TSH: 1.71 u[IU]/mL (ref 0.35–5.50)

## 2023-12-17 LAB — VITAMIN B12: Vitamin B-12: 383 pg/mL (ref 211–911)

## 2023-12-17 NOTE — Progress Notes (Signed)
 New Patient Office Visit  Subjective    Patient ID: Janet Mccoy, female    DOB: [birth year]  Age: 33 y.o. MRN: [id number]  CC:  Chief Complaint  Patient presents with   Establish Care    Pt complains of having a knot on top of her head on going for a year. No pain. Pt states that her fingers are discolored and have numbness sometimes.     HPI Maripaz Carbonell presents to establish care  ADD: [employer] and is seen every 3 months   Anxeity: does well on celexa and use the alprazolam as needed. Makes her sleepy  Mass on head: over a year. Has not changed. States that it has moved around. No discharge or  bleeding   Numbness: states that it has been in both hands. Has been working in a cold environment for the 5 years. States that it has been happening mainly at work.  States the color will go out of it and then returned.  She does work in a cooler at [employer]  Tdap:  within 10 years  Flu: refused  Covid: refused  Pna: Too young Shingles: Too young Hpv: unsure   Pap smear: [facility]. States within the last couple years. Has a nexplanon Mammogram: Too young, currently average risk Colonoscopy: Too young, currently average risk   Outpatient Encounter Medications as of 12/17/2023  Medication Sig   ALPRAZolam (XANAX) 1 MG tablet Take 0.5-1 mg by mouth 2 (two) times daily as needed.   amphetamine-dextroamphetamine (ADDERALL XR) 20 MG 24 hr capsule Take 20 mg by mouth.   citalopram (CELEXA) 20 MG tablet Take 20 mg by mouth daily.   [DISCONTINUED] enoxaparin  (LOVENOX ) 40 MG/0.4ML injection Inject 0.4 mLs (40 mg total) into the skin daily.   No facility-administered encounter medications on file as of 12/17/2023.    Past Medical History:  Diagnosis Date   ADHD    Anxiety    Depression    Endometriosis    Posterior tibial tendinitis of left lower extremity 10/2016    Past Surgical History:  Procedure Laterality Date   LAPAROSCOPY N/A  11/11/2013   Procedure: LAPAROSCOPY DIAGNOSTIC, lysis of adhesions right adenexa;  Surgeon: Charlie CHRISTELLA Croak, MD;  Location: [facility];  Service: Gynecology;  Laterality: N/A;   TENOLYSIS Left 10/31/2016   Procedure: Left posterior tibial tendon tenolysis;  Surgeon: Kit Rush, MD;  Location: [facility];  Service: Orthopedics;  Laterality: Left;    Family History  Problem Relation Age of Onset   COPD Mother     Social History   Socioeconomic History   Marital status: Single    Spouse name: Not on file   Number of children: 0   Years of education: Not on file   Highest education level: Not on file  Occupational History   Not on file  Tobacco Use   Smoking status: Former    Current packs/day: 0.00    Types: Cigarettes    Quit date: 11/05/2009    Years since quitting: 14.1   Smokeless tobacco: Never  Vaping Use   Vaping status: Every Day   Devices: Nicotine vapes  Substance and Sexual Activity   Alcohol use: Yes    Comment: occasionally   Drug use: No    Comment: AVERAGE USE 7GRAMS EVERY 2 DAYS   Sexual activity: Yes  Other Topics Concern   Not on file  Social History Narrative   Fulltime: food  loin manager    Social Drivers of Health   Financial Resource Strain: Not on file  Food Insecurity: No Food Insecurity (06/27/2021)   Received from [facility]   Hunger Vital Sign    Within the past 12 months, you worried that your food would run out before you got the money to buy more.: Never true    Within the past 12 months, the food you bought just didn't last and you didn't have money to get more.: Never true  Transportation Needs: Not on file  Physical Activity: Not on file  Stress: Not on file  Social Connections: Unknown (08/14/2021)   Received from [facility]   Social Network    Social Network: Not on file  Intimate Partner Violence: Unknown (07/13/2021)   Received from [facility]   HITS    Physically Hurt: Not on file     Insult or Talk Down To: Not on file    Threaten Physical Harm: Not on file    Scream or Curse: Not on file    Review of Systems  Constitutional:  Negative for chills and fever.  Respiratory:  Negative for shortness of breath.   Cardiovascular:  Negative for chest pain and leg swelling.  Gastrointestinal:  Negative for abdominal pain, blood in stool, constipation, diarrhea, nausea and vomiting.       Bm daily   Genitourinary:  Negative for dysuria and hematuria.  Neurological:  Positive for tingling. Negative for dizziness and headaches.  Psychiatric/Behavioral:  Negative for hallucinations and suicidal ideas.         Objective    BP 128/80   Pulse 65   Temp 98.2 F (36.8 C) (Oral)   Ht 5' 6.25 (1.683 m)   Wt 207 lb 3.2 oz (94 kg)   LMP 11/27/2023 (Approximate)   SpO2 94%   BMI 33.19 kg/m   Physical Exam Vitals and nursing note reviewed.  Constitutional:      Appearance: Normal appearance.  HENT:     Right Ear: Tympanic membrane, ear canal and external ear normal.     Left Ear: Tympanic membrane, ear canal and external ear normal.     Mouth/Throat:     Mouth: Mucous membranes are moist.     Pharynx: Oropharynx is clear.  Eyes:     Extraocular Movements: Extraocular movements intact.     Pupils: Pupils are equal, round, and reactive to light.  Cardiovascular:     Rate and Rhythm: Normal rate and regular rhythm.     Pulses: Normal pulses.     Heart sounds: Normal heart sounds.  Pulmonary:     Effort: Pulmonary effort is normal.     Breath sounds: Normal breath sounds.  Musculoskeletal:     Right lower leg: No edema.     Left lower leg: No edema.  Lymphadenopathy:     Cervical: No cervical adenopathy.  Skin:    General: Skin is warm.      Neurological:     General: No focal deficit present.     Mental Status: She is alert.     Deep Tendon Reflexes:     Reflex Scores:      Bicep reflexes are 2+ on the right side and 2+ on the left side.      Patellar  reflexes are 2+ on the right side and 2+ on the left side.    Comments: Bilateral upper and lower extremity strength 5/5  Psychiatric:  Mood and Affect: Mood normal.        Behavior: Behavior normal.        Thought Content: Thought content normal.        Judgment: Judgment normal.         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  Patient can wear gloves to try to keep hands warm while in the cooler.  Encourage stopping smoking will check TSH labs today.      Relevant Orders   TSH   Dermoid cyst of head   Cyst of head.  Patient does not want it removed not bothersome.  Has not grown no red flag symptoms in office.  If it grows, becomes painful, or starts discharging patient to let me know and we can send her to dermatology at that juncture.      Paresthesias   Pending CBC CMP, TSH, B12 level.      Relevant Orders   CBC   Comprehensive metabolic panel with GFR   Vitamin B12   TSH   Other Visit Diagnoses       Encounter for hepatitis C screening test for low risk patient       Relevant Orders   Hepatitis C antibody     Encounter for screening for HIV       Relevant Orders   HIV antibody (with reflex)     Screening for lipid disorders       Relevant Orders   Lipid panel     Screening for diabetes mellitus       Relevant Orders   Hemoglobin A1c       No follow-ups on file.   Adina Crandall, NP

## 2023-12-17 NOTE — Assessment & Plan Note (Signed)
 Characteristic of Raynaud's phenomenon with photo provided in office.  Patient can wear gloves to try to keep hands warm while in the cooler.  Encourage stopping smoking will check TSH labs today.

## 2023-12-17 NOTE — Assessment & Plan Note (Signed)
 Cyst of head.  Patient does not want it removed not bothersome.  Has not grown no red flag symptoms in office.  If it grows, becomes painful, or starts discharging patient to let me know and we can send her to dermatology at that juncture.

## 2023-12-17 NOTE — Patient Instructions (Signed)
 Nice to see you today I will be in touch with the labs once I have them Follow up with me in 6 months for your physical

## 2023-12-17 NOTE — Assessment & Plan Note (Signed)
 Pending CBC CMP, TSH, B12 level.

## 2023-12-18 LAB — HIV ANTIBODY (ROUTINE TESTING W REFLEX)
HIV 1&2 Ab, 4th Generation: NONREACTIVE
HIV FINAL INTERPRETATION: NEGATIVE

## 2023-12-18 LAB — HEPATITIS C ANTIBODY: Hepatitis C Ab: NONREACTIVE

## 2023-12-19 ENCOUNTER — Ambulatory Visit: Payer: Self-pay | Admitting: Nurse Practitioner

## 2023-12-19 DIAGNOSIS — E7800 Pure hypercholesterolemia, unspecified: Secondary | ICD-10-CM | POA: Insufficient documentation

## 2023-12-19 DIAGNOSIS — R7303 Prediabetes: Secondary | ICD-10-CM | POA: Insufficient documentation

## 2024-01-13 DIAGNOSIS — F411 Generalized anxiety disorder: Secondary | ICD-10-CM | POA: Diagnosis not present

## 2024-01-13 DIAGNOSIS — F900 Attention-deficit hyperactivity disorder, predominantly inattentive type: Secondary | ICD-10-CM | POA: Diagnosis not present

## 2024-04-11 ENCOUNTER — Emergency Department (HOSPITAL_BASED_OUTPATIENT_CLINIC_OR_DEPARTMENT_OTHER)
Admission: EM | Admit: 2024-04-11 | Discharge: 2024-04-11 | Disposition: A | Discharge status: Home / Self Care | Source: Home / Self Care | Attending: Emergency Medicine | Admitting: Emergency Medicine

## 2024-04-11 ENCOUNTER — Encounter (HOSPITAL_BASED_OUTPATIENT_CLINIC_OR_DEPARTMENT_OTHER): Payer: Self-pay | Admitting: Emergency Medicine

## 2024-04-11 ENCOUNTER — Other Ambulatory Visit: Payer: Self-pay

## 2024-04-11 DIAGNOSIS — H9201 Otalgia, right ear: Secondary | ICD-10-CM | POA: Diagnosis present

## 2024-04-11 DIAGNOSIS — H66001 Acute suppurative otitis media without spontaneous rupture of ear drum, right ear: Secondary | ICD-10-CM | POA: Insufficient documentation

## 2024-04-11 MED ORDER — AMOXICILLIN 500 MG PO CAPS: 1000.0000 mg | ORAL_CAPSULE | Freq: Two times a day (BID) | ORAL | 0 refills | Status: AC

## 2024-04-11 MED ORDER — ACETAMINOPHEN 500 MG PO TABS
1000.0000 mg | ORAL_TABLET | Freq: Once | ORAL | Status: AC
  Administered 2024-04-11: 1000 mg via ORAL
  Filled 2024-04-11: qty 2

## 2024-04-11 MED ORDER — HYDROCODONE-ACETAMINOPHEN 5-325 MG PO TABS: 1.0000 | ORAL_TABLET | ORAL | 0 refills | Status: AC | PRN

## 2024-04-11 MED ORDER — PREDNISONE 50 MG PO TABS
60.0000 mg | ORAL_TABLET | Freq: Once | ORAL | Status: AC
  Administered 2024-04-11: 60 mg via ORAL
  Filled 2024-04-11: qty 1

## 2024-04-11 MED ORDER — IBUPROFEN 800 MG PO TABS
800.0000 mg | ORAL_TABLET | Freq: Once | ORAL | Status: AC
  Administered 2024-04-11: 800 mg via ORAL
  Filled 2024-04-11: qty 1

## 2024-04-11 NOTE — ED Provider Notes (Signed)
 " [place] EMERGENCY DEPARTMENT AT [facility] Provider Note   CSN: [id number] Arrival date & time: 04/11/24  9451     Patient presents with: Otalgia   Janet Mccoy is a 34 y.o. female.   Presents to the emergency department for evaluation of right ear pain.  Patient reports that she has been sick last week with URI symptoms.  Those symptoms have been improving but then tonight she woke up with severe pain and muffled hearing in the right ear.  No drainage.       Prior to Admission medications  Medication Sig Start Date End Date Taking? Authorizing Provider  amoxicillin  (AMOXIL ) 500 MG capsule Take 2 capsules (1,000 mg total) by mouth 2 (two) times daily. 04/11/24  Yes Consuelo Suthers, Lonni PARAS, MD  HYDROcodone -acetaminophen  (NORCO/VICODIN) 5-325 MG tablet Take 1 tablet by mouth every 4 (four) hours as needed for moderate pain (pain score 4-6). 04/11/24  Yes Bjorn Hallas, Lonni PARAS, MD  ALPRAZolam (XANAX) 1 MG tablet Take 0.5-1 mg by mouth 2 (two) times daily as needed. 10/13/23   [provider]  amphetamine-dextroamphetamine (ADDERALL XR) 20 MG 24 hr capsule Take 20 mg by mouth.    [provider]  citalopram (CELEXA) 20 MG tablet Take 20 mg by mouth daily.    [provider]  enoxaparin  (LOVENOX ) 40 MG/0.4ML injection Inject 0.4 mLs (40 mg total) into the skin daily. 10/31/16 03/11/20  Aniceto Eva Grebe, PA-C    Allergies: Patient has no known allergies.    Review of Systems  Updated Vital Signs BP 133/79   Pulse 60   Temp 98.6 F (37 C) (Oral)   Resp 18   Wt 81.6 kg   SpO2 100%   BMI 28.83 kg/m   Physical Exam Vitals and nursing note reviewed.  Constitutional:      Appearance: Normal appearance.  HENT:     Head: Normocephalic.     Right Ear: Ear canal and external ear normal. Tympanic membrane is erythematous and bulging.  Pulmonary:     Effort: Pulmonary effort is normal.  Musculoskeletal:        General: Normal range of motion.   Skin:    Findings: No rash.  Neurological:     General: No focal deficit present.     Mental Status: She is alert and oriented to person, place, and time.     Cranial Nerves: Cranial nerves 2-12 are intact.     Sensory: Sensation is intact.     Motor: Motor function is intact.     (all labs ordered are listed, but only abnormal results are displayed) Labs Reviewed - No data to display  EKG: None  Radiology: No results found.   Procedures   Medications Ordered in the ED  acetaminophen  (TYLENOL ) tablet 1,000 mg (has no administration in time range)  ibuprofen  (ADVIL ) tablet 800 mg (has no administration in time range)  predniSONE  (DELTASONE ) tablet 60 mg (has no administration in time range)                                    Medical Decision Making  Presents to emergency department for evaluation of right ear pain.  Tympanic membrane examination reveals significant erythema with some slight bulge.  Will treat for acute otitis media.     Final diagnoses:  Non-recurrent acute suppurative otitis media of right ear without spontaneous rupture of tympanic membrane  ED Discharge Orders          Ordered    HYDROcodone -acetaminophen  (NORCO/VICODIN) 5-325 MG tablet  Every 4 hours PRN        04/11/24 0603    amoxicillin  (AMOXIL ) 500 MG capsule  2 times daily        04/11/24 0603               Haze Lonni PARAS, MD 04/11/24 [phone]  "

## 2024-04-11 NOTE — ED Triage Notes (Signed)
 Patient c/o right ear pain that woke her up this morning.
